# Patient Record
Sex: Male | Born: 1970 | Race: Black or African American | Hispanic: No | Marital: Married | Smoking: Never smoker
Health system: Southern US, Community
[De-identification: ages and names within clinical notes are randomized; demographics above are authoritative.]

## PROBLEM LIST (undated history)

## (undated) DIAGNOSIS — E785 Hyperlipidemia, unspecified: Secondary | ICD-10-CM

## (undated) DIAGNOSIS — B353 Tinea pedis: Secondary | ICD-10-CM

## (undated) DIAGNOSIS — L219 Seborrheic dermatitis, unspecified: Secondary | ICD-10-CM

## (undated) HISTORY — DX: Seborrheic dermatitis, unspecified: L21.9

## (undated) HISTORY — PX: WISDOM TOOTH EXTRACTION: SHX21

## (undated) HISTORY — DX: Tinea pedis: B35.3

## (undated) HISTORY — DX: Hyperlipidemia, unspecified: E78.5

---

## 2000-04-12 ENCOUNTER — Encounter: Admission: RE | Admit: 2000-04-12 | Discharge: 2000-04-12 | Payer: Self-pay | Admitting: Family Medicine

## 2004-08-18 ENCOUNTER — Ambulatory Visit: Payer: Self-pay | Admitting: Family Medicine

## 2005-10-12 ENCOUNTER — Ambulatory Visit: Payer: Self-pay | Admitting: Family Medicine

## 2007-10-09 ENCOUNTER — Encounter: Payer: Self-pay | Admitting: Family Medicine

## 2007-10-10 ENCOUNTER — Ambulatory Visit: Payer: Self-pay | Admitting: Family Medicine

## 2007-10-10 LAB — CONVERTED CEMR LAB
Bilirubin Urine: NEGATIVE
GC Probe Amp, Urine: NEGATIVE
Nitrite: NEGATIVE
Specific Gravity, Urine: 1.02
Urobilinogen, UA: 0.2

## 2007-10-13 ENCOUNTER — Encounter: Payer: Self-pay | Admitting: Family Medicine

## 2009-04-01 ENCOUNTER — Ambulatory Visit: Payer: Self-pay | Admitting: Family Medicine

## 2009-04-01 DIAGNOSIS — L259 Unspecified contact dermatitis, unspecified cause: Secondary | ICD-10-CM

## 2009-04-01 DIAGNOSIS — B353 Tinea pedis: Secondary | ICD-10-CM

## 2010-03-03 NOTE — Assessment & Plan Note (Signed)
Summary: cpe,tcb   Vital Signs:  Patient profile:   40 year old male Height:      65.5 inches Weight:      157.7 pounds BMI:     25.94 Pulse rate:   70 / minute BP sitting:   117 / 70  (right arm)  Vitals Entered By: Renato Battles slade,cma CC: physical Is Patient Diabetic? No Pain Assessment Patient in pain? no        CC:  physical.  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 The have a sitter some days.  Son, Berna Spare age one, doesn't want to stay in the stroller  Research analysis with stressful deadlines.   Rash betwen toes. Used Miconazole before. Sweats over sternum and gets mildly itchy rash there.   Habits & Providers  Alcohol-Tobacco-Diet     Tobacco Status: never  Current Medications (verified): 1)  None  Allergies (verified): No Known Drug Allergies  Review of Systems       rare headache   Physical Exam  General:  Well-developed,well-nourished,in no acute distress; alert,appropriate and cooperative throughout examination Head:  Normocephalic and atraumatic without obvious abnormalities. No apparent alopecia or balding. Eyes:  No corneal or conjunctival inflammation noted. EOMI. Perrla. Funduscopic exam benign, without hemorrhages, exudates or papilledema. Vision grossly normal. Ears:  External ear exam shows no significant lesions or deformities.  Otoscopic examination reveals clear canals, tympanic membranes are intact bilaterally without bulging, retraction, inflammation or discharge. Hearing is grossly normal bilaterally. Nose:  External nasal examination shows no deformity or inflammation. Nasal mucosa are pink and moist without lesions or exudates. Mouth:  Oral mucosa and oropharynx without lesions or exudates.  Teeth in good repair. Neck:  No deformities, masses, or tenderness noted. Lungs:  Normal respiratory effort, chest expands symmetrically. Lungs are clear  to auscultation, no crackles or wheezes. Heart:  Normal rate and regular rhythm. S1 and S2 normal without gallop, murmur, click, rub or other extra sounds. Abdomen:  Bowel sounds positive,abdomen soft and non-tender without masses, organomegaly or hernias noted. Mildly obese Genitalia:  circumcised, no hydrocele, no varicocele, and no scrotal masses.   Msk:  No deformity or scoliosis noted of thoracic or lumbar spine.   Extremities:  No clubbing, cyanosis, edema, or deformity noted with normal full range of motion of all joints.   Neurologic:  No cranial nerve deficits noted. Station and gait are normal. Plantar reflexes are down-going bilaterally. DTRs are symmetrical throughout. Sensory, motor and coordinative functions appear intact. Skin:  Tinea changes between toes  Thickened dark skin patch 3x5 cm over upper sternum.  Cervical Nodes:  No lymphadenopathy noted Axillary Nodes:  No palpable lymphadenopathy Inguinal Nodes:  No significant adenopathy Psych:  As usual,  subdued and poor eye contact but pleasant and cooperative.    Impression & Recommendations:  Problem # 1:  OVERWEIGHT (ICD-278.02) Encouraged calorie reduction and exercise. Orders: [facility] - Est  18-39 yrs ([id number])  Problem # 2:  ECZEMA (ICD-692.9)  Hydrocortisone two times a day to sternum  His updated medication list for this problem includes:    Hydrocortisone 1 % Crea (Hydrocortisone) .Marland Kitchen... Apply two times a day prn  Problem # 3:  DERMATOPHYTOSIS OF FOOT (ICD-110.4)  Miconazole 3-4 weeks Orders: [facility] - Est  18-39 yrs ([id number])  His updated medication list for this problem includes:    Micaderm 2 % Crea (Miconazole nitrate) .Marland Kitchen... Apply two times a  day  Complete Medication List: 1)  Micaderm 2 % Crea (Miconazole nitrate) .... Apply two times a day 2)  Hydrocortisone 1 % Crea (Hydrocortisone) .... Apply two times a day prn  Other Orders: [facility] ([id number])  Patient Instructions: 1)  Please schedule a follow-up  appointment in 1 year.  2)  You need to lose weight. Consider a lower calorie diet and regular exercise. Keep your waist size under 36 at belly button level. 3)  Apply hydrocortisone 1% to the chest rash twice daily when irritated.  4)  Miconazole two times a day between toes for fungus.  5)  Recheck age 80   Prevention & Chronic Care Immunizations   Influenza vaccine: Not documented    Tetanus booster: 08/01/2004: Done.   Tetanus booster due: 08/02/2014    Pneumococcal vaccine: Not documented   Pneumococcal vaccine deferral: Not indicated  (04/01/2009)  Other Screening   Smoking status: never  (04/01/2009)  Lipids   Total Cholesterol: Not documented   LDL: 106  (10/13/2005)   LDL Direct: Not documented   HDL: 47  (10/13/2005)   Triglycerides: Not documented  Laboratory Results  Date/Time Received: April 01, 2009 4:45 PM  Date/Time Reported: April 01, 2009 5:01 PM   Other Tests  Skin KOH: Negative Comments: ...........test performed by...........Marland KitchenTerese Door, CMA

## 2010-03-08 ENCOUNTER — Encounter: Payer: Self-pay | Admitting: *Deleted

## 2010-06-30 ENCOUNTER — Encounter: Payer: Self-pay | Admitting: Family Medicine

## 2010-06-30 ENCOUNTER — Ambulatory Visit (INDEPENDENT_AMBULATORY_CARE_PROVIDER_SITE_OTHER): Payer: Managed Care, Other (non HMO) | Admitting: Family Medicine

## 2010-06-30 VITALS — BP 118/70 | HR 70 | Temp 98.0°F | Ht 66.0 in | Wt 162.0 lb

## 2010-06-30 DIAGNOSIS — M546 Pain in thoracic spine: Secondary | ICD-10-CM | POA: Insufficient documentation

## 2010-06-30 MED ORDER — IBUPROFEN 200 MG PO TABS: 600.0000 mg | ORAL_TABLET | Freq: Four times a day (QID) | ORAL | Status: AC | PRN

## 2010-06-30 NOTE — Progress Notes (Signed)
  Subjective:    Patient ID: [name], male    DOB: [birth year], 40 y.o.   MRN: [id number]  HPI He has been on paternity leave for over a month for the birth of his second child. This last month he's had pain in his right mid back 3-4 times daily usually with leaning forward or twisting to the side. He is not aware of any specific injury but has been picking up his 57-year-old son who now weighs over 30 pounds. He denies pain with deep breath or cough. He denies cough shortness of breath or urinary tract symptoms. Been gradually gaining weight and has not been exercising recently.   He recently turned 40 and his wife is nearing 48 and they are considering him having a vasectomy.   His athlete foot symptoms have resolved and he is no longer using hydrocortisone cream Review of Systems     Objective:   Physical Exam well-appearing mild abdominal obesity. Quiet demeanor as usual. Chest clear heart regular rhythm without murmur Mild tenderness with pressure over the posterior 11th and 12th ribs pain was not reproduced with observed bending forward or trunk twisting abdomen soft without masses or tenderness        Assessment & Plan:

## 2010-06-30 NOTE — Assessment & Plan Note (Signed)
Appears to be musculoskeletal, Probably related to lifting his son more being home on paternity leave

## 2010-06-30 NOTE — Patient Instructions (Signed)
Call Dr Sheffield Slider in one week if the back pain is not improved.   Gradually increase exercise and warm up before sports

## 2010-07-08 ENCOUNTER — Telehealth: Payer: Self-pay | Admitting: Family Medicine

## 2010-07-08 NOTE — Telephone Encounter (Signed)
Pt calling to let MD know he does not have anymore back pain.

## 2011-04-20 ENCOUNTER — Ambulatory Visit (INDEPENDENT_AMBULATORY_CARE_PROVIDER_SITE_OTHER): Payer: Managed Care, Other (non HMO) | Admitting: Family Medicine

## 2011-04-20 ENCOUNTER — Encounter: Payer: Self-pay | Admitting: Family Medicine

## 2011-04-20 VITALS — BP 104/82 | HR 76 | Temp 98.2°F | Ht 66.0 in | Wt 163.0 lb

## 2011-04-20 DIAGNOSIS — H109 Unspecified conjunctivitis: Secondary | ICD-10-CM | POA: Insufficient documentation

## 2011-04-20 MED ORDER — POLYMYXIN B-TRIMETHOPRIM 10000-0.1 UNIT/ML-% OP SOLN: 1.0000 [drp] | Freq: Four times a day (QID) | OPHTHALMIC | Status: AC

## 2011-04-20 NOTE — Patient Instructions (Signed)
I have sent in antibiotic drops for you Please use them 4 times a day for 5 days. Call us if you are still having significant drainage in 2 days

## 2011-04-20 NOTE — Progress Notes (Signed)
  Subjective:    Patient ID: [name], male    DOB: September 20, 1970, 41 y.o.   MRN: [id number]  HPI Here with one day of left eye redness and drainage.  Both of his kids have had pink eye in the last two weeks.  He woke up with crusting and it has continued to drain.  He does not have aversion to light, pain in the eye or itching or vision change.  He does not wear contacts.   He is other wise healthy. Review of Systems See above    Objective:   Physical Exam  Vital signs reviewed General appearance - alert, well appearing, and in no distress and oriented to person, place, and time Eyes- left eye with conjunctival injection.  Eye lashes with yellow-green discharge stuck to them.  EOMI.  PERRL      Assessment & Plan:

## 2011-04-20 NOTE — Assessment & Plan Note (Signed)
Gave drops for likely bacterial conjunctivitis.  To return in 2 days if no improvement

## 2011-04-21 ENCOUNTER — Telehealth: Payer: Self-pay | Admitting: Family Medicine

## 2011-04-21 NOTE — Telephone Encounter (Signed)
Returned call to patient.  Seen yesterday for conjunctivitis.  Used eye drops x 4 yesterday and has used twice today.  Eye was swollen shut last night, but swelling has gone down some today.  Eye still sore, itchy, and very red.  Has had some clear eye drainage.  Informed patient to continue using eye drops as prescribed and call back if no improvement by tomorrow per yesterday's office note.  Patient verbalized understanding.  Gaylene Brooks, RN

## 2011-04-21 NOTE — Telephone Encounter (Signed)
Eyes are still red and painful and has put drops in 4 times yesterday and twice today - wants to know if that is normal

## 2011-04-22 ENCOUNTER — Ambulatory Visit (INDEPENDENT_AMBULATORY_CARE_PROVIDER_SITE_OTHER): Payer: Managed Care, Other (non HMO) | Admitting: Family Medicine

## 2011-04-22 ENCOUNTER — Encounter: Payer: Self-pay | Admitting: Family Medicine

## 2011-04-22 VITALS — BP 118/80 | HR 64 | Temp 99.5°F | Ht 66.0 in | Wt 162.0 lb

## 2011-04-22 DIAGNOSIS — H109 Unspecified conjunctivitis: Secondary | ICD-10-CM

## 2011-04-22 DIAGNOSIS — J029 Acute pharyngitis, unspecified: Secondary | ICD-10-CM

## 2011-04-22 DIAGNOSIS — J069 Acute upper respiratory infection, unspecified: Secondary | ICD-10-CM

## 2011-04-22 LAB — POCT RAPID STREP A (OFFICE): Rapid Strep A Screen: NEGATIVE

## 2011-04-22 NOTE — Progress Notes (Signed)
  Subjective:    Patient ID: [name], male    DOB: March 03, 1970, 41 y.o.   MRN: [id number]  HPI Cold symptoms: Patient reports cold symptoms x3 days. Started with pinkeye. Was seen here clinic and given antibiotic drops. Has had some minimal improvement in pinkeye and then right eye became infected. Positive chills. Positive body aches. Positive sore throat. Positive runny nose. Tylenol seems to help body aches. Eating well. Drinking well. No nausea. No vomiting. No diarrhea. No changes in vision. No abdominal pain.   Review of Systems As per above.    Objective:   Physical Exam  HENT:  Head: Normocephalic and atraumatic.  Mouth/Throat: No oropharyngeal exudate.       TM wnl bilateral.  Mild throat erythema.  + clear nasal discharge.   Eyes: EOM are normal. Pupils are equal, round, and reactive to light. Right eye exhibits discharge. Left eye exhibits discharge.       + erythematous conjunctiva bilateral.  Right worse than left.  + yellow discharge.    Neck: Neck supple.  Cardiovascular: Normal rate, regular rhythm and normal heart sounds.   No murmur heard. Pulmonary/Chest: Effort normal and breath sounds normal. No respiratory distress. He has no wheezes. He has no rales.  Abdominal: Soft. He exhibits no distension. There is no tenderness. There is no rebound.  Musculoskeletal: He exhibits no edema.  Lymphadenopathy:    He has cervical adenopathy (submandibular- shoddy).  Neurological: He is alert.       Normal gait  Skin: No rash noted.          Assessment & Plan:

## 2011-04-22 NOTE — Patient Instructions (Signed)
Body aches and sorethroat: Motrin and tylenol as needed.   Sorethroat: Chloraseptic spray, Salt water gargles Continue to drink lots of fluid  Pink eye: Continue to use antibiotic drops-- if this is a viral conjunctivitis this may not help.  Runny nose- Nasal saline spray.   Return if new or worsening of symptoms.

## 2011-04-24 DIAGNOSIS — J069 Acute upper respiratory infection, unspecified: Secondary | ICD-10-CM | POA: Insufficient documentation

## 2011-04-24 NOTE — Assessment & Plan Note (Signed)
Symptomatic treatment only at this time. Reviewed red flags for return.  Return if no improvement or if new or worsening of symptoms.

## 2011-04-24 NOTE — Assessment & Plan Note (Signed)
Continue to use antibiotic eye drops--most likely conjunctivitis is viral in the setting of viral uri symptoms - therefore bacterial drops may not help-  But should continue to use in case there is a secondary bacterial infection causing the drainage and continued eye redness.  Pt states understanding.  Pt to Return if no improvement or if new or worsening of symptoms.

## 2012-01-04 ENCOUNTER — Ambulatory Visit (INDEPENDENT_AMBULATORY_CARE_PROVIDER_SITE_OTHER): Payer: Managed Care, Other (non HMO) | Admitting: Family Medicine

## 2012-01-04 ENCOUNTER — Encounter: Payer: Self-pay | Admitting: Family Medicine

## 2012-01-04 ENCOUNTER — Encounter: Payer: Managed Care, Other (non HMO) | Admitting: Family Medicine

## 2012-01-04 VITALS — BP 132/71 | HR 77 | Ht 66.0 in | Wt 157.7 lb

## 2012-01-04 DIAGNOSIS — E663 Overweight: Secondary | ICD-10-CM

## 2012-01-04 DIAGNOSIS — L259 Unspecified contact dermatitis, unspecified cause: Secondary | ICD-10-CM

## 2012-01-04 DIAGNOSIS — Z0000 Encounter for general adult medical examination without abnormal findings: Secondary | ICD-10-CM

## 2012-01-04 DIAGNOSIS — B353 Tinea pedis: Secondary | ICD-10-CM

## 2012-01-04 DIAGNOSIS — L219 Seborrheic dermatitis, unspecified: Secondary | ICD-10-CM

## 2012-01-04 HISTORY — DX: Seborrheic dermatitis, unspecified: L21.9

## 2012-01-04 HISTORY — DX: Tinea pedis: B35.3

## 2012-01-04 MED ORDER — TERBINAFINE HCL 1 % EX CREA: TOPICAL_CREAM | Freq: Two times a day (BID) | CUTANEOUS | Status: DC

## 2012-01-04 NOTE — Assessment & Plan Note (Signed)
Recommended he use Lamisil more frequently between his toes.

## 2012-01-04 NOTE — Patient Instructions (Addendum)
Please send me your wellness results  I'll let you know the fungus scraping result  Please return to see Dr Sheffield Slider in 1 year.

## 2012-01-04 NOTE — Progress Notes (Signed)
  Subjective:    Patient ID: [name], male    DOB: 12/07/1970, 41 y.o.   MRN: [id number]  HPI A week and a half ago he developed pain in his right forehead without lesions, scaling of his lower forehead and eyebrows and a tender papule in his lower right eyebrow plus a tender right preauricular nodule. Only the scaling and a decreasing papule remains. He never has blisters. No similar illness in his family.   He had a health screen at work that indicated that his cholesterol is normal, as were his blood pressure, weight, and waist size Review of Systems  Constitutional: Negative for fever and chills.  HENT: Positive for postnasal drip. Negative for ear pain, congestion, sore throat and ear discharge.   Eyes: Negative for discharge and redness.  Respiratory: Negative for cough.   Cardiovascular: Negative for chest pain.  Gastrointestinal: Negative for diarrhea and constipation.  Musculoskeletal: Negative for myalgias, back pain and arthralgias.  Neurological: Negative for headaches.  Hematological: Positive for adenopathy.  Psychiatric/Behavioral: Negative for sleep disturbance and agitation.       Objective:   Physical Exam  Constitutional: He appears well-developed and well-nourished.  HENT:  Head: Normocephalic.  Right Ear: External ear normal.  Left Ear: External ear normal.  Nose: Nose normal.  Mouth/Throat: Oropharynx is clear and moist.  Eyes: Conjunctivae normal are normal. Pupils are equal, round, and reactive to light. Right eye exhibits no discharge. Left eye exhibits discharge. No scleral icterus.  Neck: No thyromegaly present.  Cardiovascular: Normal rate and regular rhythm.   No murmur heard. Pulmonary/Chest: Effort normal and breath sounds normal. He has no rales.  Abdominal: Soft. Bowel sounds are normal. He exhibits no mass. There is no tenderness.  Genitourinary: Penis normal.       Testes normal  Musculoskeletal: Normal range of motion. He exhibits no  edema and no tenderness.  Lymphadenopathy:    He has no cervical adenopathy.  Neurological: He is alert. No cranial nerve deficit. Coordination normal.  Skin: Skin is warm and dry. Rash noted.       superficial desquamation of lower forehead into eyebrows  Palpable firm 2 mm nodule right mid eyebrow.  Maceration and fissuring interdigitally lateral 3 toes bilaterally  Psychiatric: He has a normal mood and affect. His behavior is normal. Judgment and thought content normal.          Assessment & Plan:

## 2012-01-04 NOTE — Assessment & Plan Note (Signed)
Could be seborrhea on his forehead vs a contact dermatitis. KOH was negative for fungus on the forehead.

## 2012-01-04 NOTE — Assessment & Plan Note (Signed)
It persistent may treat with Nizoral

## 2012-01-04 NOTE — Assessment & Plan Note (Signed)
Nearly down to the normal range

## 2012-01-18 ENCOUNTER — Telehealth: Payer: Self-pay | Admitting: Family Medicine

## 2012-01-18 NOTE — Telephone Encounter (Signed)
His blood pressure was 134/100 on his screen at work on 12/2011. Waist size 36 BMI 24.0 Total cholesterol 165

## 2013-07-09 ENCOUNTER — Ambulatory Visit (INDEPENDENT_AMBULATORY_CARE_PROVIDER_SITE_OTHER): Payer: Managed Care, Other (non HMO) | Admitting: Family Medicine

## 2013-07-09 VITALS — BP 132/80 | HR 73 | Temp 97.8°F | Ht 66.0 in | Wt 159.0 lb

## 2013-07-09 DIAGNOSIS — M79609 Pain in unspecified limb: Secondary | ICD-10-CM

## 2013-07-09 DIAGNOSIS — M79646 Pain in unspecified finger(s): Secondary | ICD-10-CM | POA: Insufficient documentation

## 2013-07-09 DIAGNOSIS — Z0000 Encounter for general adult medical examination without abnormal findings: Secondary | ICD-10-CM

## 2013-07-09 NOTE — Assessment & Plan Note (Signed)
43 y/o male for annual well visit. -Up to date on immunizations -Not a candidate for colon or prostate cancer screening at this time -Dicussed the importance of a healthy diet and regular exercise -Routine lab work obtained

## 2013-07-09 NOTE — Patient Instructions (Signed)
Please schedule an appointment with our lab to get your lab work completed. Please fast for at least 8 hours.  Dr. Ree Kida will call you with your results.   Preventive Care for Adults, Male A healthy lifestyle and preventive care can promote health and wellness. Preventive health guidelines for men include the following key practices:  A routine yearly physical is a good way to check with your health care provider about your health and preventative screening. It is a chance to share any concerns and updates on your health and to receive a thorough exam.  Visit your dentist for a routine exam and preventative care every 6 months. Brush your teeth twice a day and floss once a day. Good oral hygiene prevents tooth decay and gum disease.  The frequency of eye exams is based on your age, health, family medical history, use of contact lenses, and other factors. Follow your health care provider's recommendations for frequency of eye exams.  Eat a healthy diet. Foods such as vegetables, fruits, whole grains, low-fat dairy products, and lean protein foods contain the nutrients you need without too many calories. Decrease your intake of foods high in solid fats, added sugars, and salt. Eat the right amount of calories for you.Get information about a proper diet from your health care provider, if necessary.  Regular physical exercise is one of the most important things you can do for your health. Most adults should get at least 150 minutes of moderate-intensity exercise (any activity that increases your heart rate and causes you to sweat) each week. In addition, most adults need muscle-strengthening exercises on 2 or more days a week.  Maintain a healthy weight. The body mass index (BMI) is a screening tool to identify possible weight problems. It provides an estimate of body fat based on height and weight. Your health care provider can find your BMI and can help you achieve or maintain a healthy weight.For  adults 20 years and older:  A BMI below 18.5 is considered underweight.  A BMI of 18.5 to 24.9 is normal.  A BMI of 25 to 29.9 is considered overweight.  A BMI of 30 and above is considered obese.  Maintain normal blood lipids and cholesterol levels by exercising and minimizing your intake of saturated fat. Eat a balanced diet with plenty of fruit and vegetables. Blood tests for lipids and cholesterol should begin at age 53 and be repeated every 5 years. If your lipid or cholesterol levels are high, you are over 50, or you are at high risk for heart disease, you may need your cholesterol levels checked more frequently.Ongoing high lipid and cholesterol levels should be treated with medicines if diet and exercise are not working.  If you smoke, find out from your health care provider how to quit. If you do not use tobacco, do not start.  Lung cancer screening is recommended for adults aged 33 80 years who are at high risk for developing lung cancer because of a history of smoking. A yearly low-dose CT scan of the lungs is recommended for people who have at least a 30-pack-year history of smoking and are a current smoker or have quit within the past 15 years. A pack year of smoking is smoking an average of 1 pack of cigarettes a day for 1 year (for example: 1 pack a day for 30 years or 2 packs a day for 15 years). Yearly screening should continue until the smoker has stopped smoking for at least 15 years.  Yearly screening should be stopped for people who develop a health problem that would prevent them from having lung cancer treatment.  If you choose to drink alcohol, do not have more than 2 drinks per day. One drink is considered to be 12 ounces (355 mL) of beer, 5 ounces (148 mL) of wine, or 1.5 ounces (44 mL) of liquor.  Avoid use of street drugs. Do not share needles with anyone. Ask for help if you need support or instructions about stopping the use of drugs.  High blood pressure causes  heart disease and increases the risk of stroke. Your blood pressure should be checked at least every 1 2 years. Ongoing high blood pressure should be treated with medicines, if weight loss and exercise are not effective.  If you are 43 43 years old, ask your health care provider if you should take aspirin to prevent heart disease.  Diabetes screening involves taking a blood sample to check your fasting blood sugar level. This should be done once every 3 years, after age 42, if you are within normal weight and without risk factors for diabetes. Testing should be considered at a younger age or be carried out more frequently if you are overweight and have at least 1 risk factor for diabetes.  Colorectal cancer can be detected and often prevented. Most routine colorectal cancer screening begins at the age of 41 and continues through age 27. However, your health care provider may recommend screening at an earlier age if you have risk factors for colon cancer. On a yearly basis, your health care provider may provide home test kits to check for hidden blood in the stool. Use of a small camera at the end of a tube to directly examine the colon (sigmoidoscopy or colonoscopy) can detect the earliest forms of colorectal cancer. Talk to your health care provider about this at age 40, when routine screening begins. Direct exam of the colon should be repeated every 5 10 years through age 34, unless early forms of precancerous polyps or small growths are found.  People who are at an increased risk for hepatitis B should be screened for this virus. You are considered at high risk for hepatitis B if:  You were born in a country where hepatitis B occurs often. Talk with your health care provider about which countries are considered high-risk.  Your parents were born in a high-risk country and you have not received a shot to protect against hepatitis B (hepatitis B vaccine).  You have HIV or AIDS.  You use needles to  inject street drugs.  You live with, or have sex with, someone who has hepatitis B.  You are a man who has sex with other men (MSM).  You get hemodialysis treatment.  You take certain medicines for conditions such as cancer, organ transplantation, and autoimmune conditions.  Hepatitis C blood testing is recommended for all people born from [birth year] through 1965 and any individual with known risks for hepatitis C.  Practice safe sex. Use condoms and avoid high-risk sexual practices to reduce the spread of sexually transmitted infections (STIs). STIs include gonorrhea, chlamydia, syphilis, trichomonas, herpes, HPV, and human immunodeficiency virus (HIV). Herpes, HIV, and HPV are viral illnesses that have no cure. They can result in disability, cancer, and death.  A one-time screening for abdominal aortic aneurysm (AAA) and surgical repair of large AAAs by ultrasound are recommended for men ages 51 to 59 years who are current or former smokers.  Healthy men should  no longer receive prostate-specific antigen (PSA) blood tests as part of routine cancer screening. Talk with your health care provider about prostate cancer screening.  Testicular cancer screening is not recommended for adult males who have no symptoms. Screening includes self-exam, a health care provider exam, and other screening tests. Consult with your health care provider about any symptoms you have or any concerns you have about testicular cancer.  Use sunscreen. Apply sunscreen liberally and repeatedly throughout the day. You should seek shade when your shadow is shorter than you. Protect yourself by wearing long sleeves, pants, a wide-brimmed hat, and sunglasses year round, whenever you are outdoors.  Once a month, do a whole-body skin exam, using a mirror to look at the skin on your back. Tell your health care provider about new moles, moles that have irregular borders, moles that are larger than a pencil eraser, or moles that have  changed in shape or color.  Stay current with required vaccines (immunizations).  Influenza vaccine. All adults should be immunized every year.  Tetanus, diphtheria, and acellular pertussis (Td, Tdap) vaccine. An adult who has not previously received Tdap or who does not know his vaccine status should receive 1 dose of Tdap. This initial dose should be followed by tetanus and diphtheria toxoids (Td) booster doses every 10 years. Adults with an unknown or incomplete history of completing a 3-dose immunization series with Td-containing vaccines should begin or complete a primary immunization series including a Tdap dose. Adults should receive a Td booster every 10 years.  Varicella vaccine. An adult without evidence of immunity to varicella should receive 2 doses or a second dose if he has previously received 1 dose.  Human papillomavirus (HPV) vaccine. Males aged 57 21 years who have not received the vaccine previously should receive the 3-dose series. Males aged 31 26 years may be immunized. Immunization is recommended through the age of 67 years for any male who has sex with males and did not get any or all doses earlier. Immunization is recommended for any person with an immunocompromised condition through the age of 29 years if he did not get any or all doses earlier. During the 3-dose series, the second dose should be obtained 4 8 weeks after the first dose. The third dose should be obtained 24 weeks after the first dose and 16 weeks after the second dose.  Zoster vaccine. One dose is recommended for adults aged 86 years or older unless certain conditions are present.  Measles, mumps, and rubella (MMR) vaccine. Adults born before [birth year] generally are considered immune to measles and mumps. Adults born in [birth year] or later should have 1 or more doses of MMR vaccine unless there is a contraindication to the vaccine or there is laboratory evidence of immunity to each of the three diseases. A routine second  dose of MMR vaccine should be obtained at least 28 days after the first dose for students attending postsecondary schools, health care workers, or international travelers. People who received inactivated measles vaccine or an unknown type of measles vaccine during 1963 1967 should receive 2 doses of MMR vaccine. People who received inactivated mumps vaccine or an unknown type of mumps vaccine before 1979 and are at high risk for mumps infection should consider immunization with 2 doses of MMR vaccine. Unvaccinated health care workers born before [birth year] who lack laboratory evidence of measles, mumps, or rubella immunity or laboratory confirmation of disease should consider measles and mumps immunization with 2 doses of MMR vaccine or  rubella immunization with 1 dose of MMR vaccine.  Pneumococcal 13-valent conjugate (PCV13) vaccine. When indicated, a person who is uncertain of his immunization history and has no record of immunization should receive the PCV13 vaccine. An adult aged 46 years or older who has certain medical conditions and has not been previously immunized should receive 1 dose of PCV13 vaccine. This PCV13 should be followed with a dose of pneumococcal polysaccharide (PPSV23) vaccine. The PPSV23 vaccine dose should be obtained at least 8 weeks after the dose of PCV13 vaccine. An adult aged 65 years or older who has certain medical conditions and previously received 1 or more doses of PPSV23 vaccine should receive 1 dose of PCV13. The PCV13 vaccine dose should be obtained 1 or more years after the last PPSV23 vaccine dose.  Pneumococcal polysaccharide (PPSV23) vaccine. When PCV13 is also indicated, PCV13 should be obtained first. All adults aged 15 years and older should be immunized. An adult younger than age 57 years who has certain medical conditions should be immunized. Any person who resides in a nursing home or long-term care facility should be immunized. An adult smoker should be immunized.  People with an immunocompromised condition and certain other conditions should receive both PCV13 and PPSV23 vaccines. People with human immunodeficiency virus (HIV) infection should be immunized as soon as possible after diagnosis. Immunization during chemotherapy or radiation therapy should be avoided. Routine use of PPSV23 vaccine is not recommended for American Indians, [place] Natives, or people younger than 65 years unless there are medical conditions that require PPSV23 vaccine. When indicated, people who have unknown immunization and have no record of immunization should receive PPSV23 vaccine. One-time revaccination 5 years after the first dose of PPSV23 is recommended for people aged 65 64 years who have chronic kidney failure, nephrotic syndrome, asplenia, or immunocompromised conditions. People who received 1 2 doses of PPSV23 before age 46 years should receive another dose of PPSV23 vaccine at age 48 years or later if at least 5 years have passed since the previous dose. Doses of PPSV23 are not needed for people immunized with PPSV23 at or after age 41 years.  Meningococcal vaccine. Adults with asplenia or persistent complement component deficiencies should receive 2 doses of quadrivalent meningococcal conjugate (MenACWY-D) vaccine. The doses should be obtained at least 2 months apart. Microbiologists working with certain meningococcal bacteria, [place] recruits, people at risk during an outbreak, and people who travel to or live in countries with a high rate of meningitis should be immunized. A first-year college student up through age 56 years who is living in a residence hall should receive a dose if he did not receive a dose on or after his 16th birthday. Adults who have certain high-risk conditions should receive one or more doses of vaccine.  Hepatitis A vaccine. Adults who wish to be protected from this disease, have certain high-risk conditions, work with hepatitis A-infected animals, work  in hepatitis A research labs, or travel to or work in countries with a high rate of hepatitis A should be immunized. Adults who were previously unvaccinated and who anticipate close contact with an international adoptee during the first 60 days after arrival in the [place] from a country with a high rate of hepatitis A should be immunized.  Hepatitis B vaccine. Adults who wish to be protected from this disease, have certain high-risk conditions, may be exposed to blood or other infectious body fluids, are household contacts or sex partners of hepatitis B positive people, are clients  or workers in certain care facilities, or travel to or work in countries with a high rate of hepatitis B should be immunized.  Haemophilus influenzae type b (Hib) vaccine. A previously unvaccinated person with asplenia or sickle cell disease or having a scheduled splenectomy should receive 1 dose of Hib vaccine. Regardless of previous immunization, a recipient of a hematopoietic stem cell transplant should receive a 3-dose series 6 12 months after his successful transplant. Hib vaccine is not recommended for adults with HIV infection. Preventive Service / Frequency Ages 26 to 67  Blood pressure check.** / Every 1 to 2 years.  Lipid and cholesterol check.** / Every 5 years beginning at age 16.  Hepatitis C blood test.** / For any individual with known risks for hepatitis C.  Skin self-exam. / Monthly.  Influenza vaccine. / Every year.  Tetanus, diphtheria, and acellular pertussis (Tdap, Td) vaccine.** / Consult your health care provider. 1 dose of Td every 10 years.  Varicella vaccine.** / Consult your health care provider.  HPV vaccine. / 3 doses over 6 months, if 26 or younger.  Measles, mumps, rubella (MMR) vaccine.** / You need at least 1 dose of MMR if you were born in 1957 or later. You may also need a second dose.  Pneumococcal 13-valent conjugate (PCV13) vaccine.** / Consult your health care  provider.  Pneumococcal polysaccharide (PPSV23) vaccine.** / 1 to 2 doses if you smoke cigarettes or if you have certain conditions.  Meningococcal vaccine.** / 1 dose if you are age 45 to 60 years and a Market researcher living in a residence hall, or have one of several medical conditions. You may also need additional booster doses.  Hepatitis A vaccine.** / Consult your health care provider.  Hepatitis B vaccine.** / Consult your health care provider.  Haemophilus influenzae type b (Hib) vaccine.** / Consult your health care provider. Ages 62 to 22  Blood pressure check.** / Every 1 to 2 years.  Lipid and cholesterol check.** / Every 5 years beginning at age 4.  Lung cancer screening. / Every year if you are aged 51 80 years and have a 30-pack-year history of smoking and currently smoke or have quit within the past 15 years. Yearly screening is stopped once you have quit smoking for at least 15 years or develop a health problem that would prevent you from having lung cancer treatment.  Fecal occult blood test (FOBT) of stool. / Every year beginning at age 30 and continuing until age 44. You may not have to do this test if you get a colonoscopy every 10 years.  Flexible sigmoidoscopy** or colonoscopy.** / Every 5 years for a flexible sigmoidoscopy or every 10 years for a colonoscopy beginning at age 63 and continuing until age 57.  Hepatitis C blood test.** / For all people born from [birth year] through 1965 and any individual with known risks for hepatitis C.  Skin self-exam. / Monthly.  Influenza vaccine. / Every year.  Tetanus, diphtheria, and acellular pertussis (Tdap/Td) vaccine.** / Consult your health care provider. 1 dose of Td every 10 years.  Varicella vaccine.** / Consult your health care provider.  Zoster vaccine.** / 1 dose for adults aged 31 years or older.  Measles, mumps, rubella (MMR) vaccine.** / You need at least 1 dose of MMR if you were born in 1957 or  later. You may also need a second dose.  Pneumococcal 13-valent conjugate (PCV13) vaccine.** / Consult your health care provider.  Pneumococcal polysaccharide (PPSV23) vaccine.** / 1  to 2 doses if you smoke cigarettes or if you have certain conditions.  Meningococcal vaccine.** / Consult your health care provider.  Hepatitis A vaccine.** / Consult your health care provider.  Hepatitis B vaccine.** / Consult your health care provider.  Haemophilus influenzae type b (Hib) vaccine.** / Consult your health care provider. Ages 40 and over  Blood pressure check.** / Every 1 to 2 years.  Lipid and cholesterol check.**/ Every 5 years beginning at age 74.  Lung cancer screening. / Every year if you are aged 78 80 years and have a 30-pack-year history of smoking and currently smoke or have quit within the past 15 years. Yearly screening is stopped once you have quit smoking for at least 15 years or develop a health problem that would prevent you from having lung cancer treatment.  Fecal occult blood test (FOBT) of stool. / Every year beginning at age 25 and continuing until age 6. You may not have to do this test if you get a colonoscopy every 10 years.  Flexible sigmoidoscopy** or colonoscopy.** / Every 5 years for a flexible sigmoidoscopy or every 10 years for a colonoscopy beginning at age 41 and continuing until age 9.  Hepatitis C blood test.** / For all people born from [birth year] through 1965 and any individual with known risks for hepatitis C.  Abdominal aortic aneurysm (AAA) screening.** / A one-time screening for ages 36 to 45 years who are current or former smokers.  Skin self-exam. / Monthly.  Influenza vaccine. / Every year.  Tetanus, diphtheria, and acellular pertussis (Tdap/Td) vaccine.** / 1 dose of Td every 10 years.  Varicella vaccine.** / Consult your health care provider.  Zoster vaccine.** / 1 dose for adults aged 37 years or older.  Pneumococcal 13-valent conjugate  (PCV13) vaccine.** / Consult your health care provider.  Pneumococcal polysaccharide (PPSV23) vaccine.** / 1 dose for all adults aged 35 years and older.  Meningococcal vaccine.** / Consult your health care provider.  Hepatitis A vaccine.** / Consult your health care provider.  Hepatitis B vaccine.** / Consult your health care provider.  Haemophilus influenzae type b (Hib) vaccine.** / Consult your health care provider. **Family history and personal history of risk and conditions may change your health care provider's recommendations. Document Released: 03/16/2001 Document Revised: 11/08/2012 Document Reviewed: 06/15/2010 [facility] Patient Information 2014 [place], [place].

## 2013-07-09 NOTE — Assessment & Plan Note (Signed)
Intermittent left thumb pain. No acute tenderness/redness/swelling -attempt trial of NSAID's and ice

## 2013-07-09 NOTE — Progress Notes (Signed)
   Subjective:    Patient ID: [name], male    DOB: Jul 03, 1970, 43 y.o.   MRN: [id number]  HPI 43 y/o male presents for annual preventative visit.  Acute issues: left thumb pain, intermittent over the past few weeks, no injury, no swelling or redness, pain mostly over the DIP joints when he applies pressure to the area, has not attempted any otc medications or heat/ice  Exercise - works out 1-2 times per week  Cancer Screening:  Colon: not a candidate  Prostate: no hesitancy, no nocturia, good urine flow  Immunizations:  Tetanus: 5670  Flu: uncertain  Pneumovax: not a candidate   FH - no family history of colon or prostate cancer  Updated Social History in EPIC   Review of Systems  Constitutional: Negative for fever, chills and fatigue.  Respiratory: Negative for cough and shortness of breath.   Cardiovascular: Negative for chest pain.  Gastrointestinal: Negative for nausea and vomiting.  Musculoskeletal: Positive for arthralgias.       Objective:   Physical Exam Vitals: reviewed Gen: pleasant AAM, NAD HEENT: normocephalic, PERRL, EOMI, no scleral icterus, bilateral TM's pearly grey, nasal septum midline, MMM, uvula midline, no pharyngeal erythema or exudate noted, no thyromegaly, no cervical lymphadenopathy Cardiac: RRR, S1 and S2 present, no murmurs, no heaves/thrills Resp: CTAB, normal effort Abd: soft, no tenderness, no organomegaly, normal bowel sounds Ext: no edema, 2+ radial and DP pulses bilaterally Skin: no suspicious skin lesions MSK: normal ROM of left thumb DIP/PIP, no swelling, no point tenderness of the left thumb     Assessment & Plan:  Please see problem specific assessment and plan.

## 2013-07-24 ENCOUNTER — Encounter: Payer: Self-pay | Admitting: Family Medicine

## 2013-07-24 ENCOUNTER — Ambulatory Visit (INDEPENDENT_AMBULATORY_CARE_PROVIDER_SITE_OTHER): Payer: Managed Care, Other (non HMO) | Admitting: Family Medicine

## 2013-07-24 VITALS — BP 115/77 | HR 64 | Ht 66.0 in | Wt 156.0 lb

## 2013-07-24 DIAGNOSIS — Z0000 Encounter for general adult medical examination without abnormal findings: Secondary | ICD-10-CM

## 2013-07-24 LAB — CMP AND LIVER
ALBUMIN: 4.6 g/dL (ref 3.5–5.2)
ALT: 17 U/L (ref 0–53)
AST: 19 U/L (ref 0–37)
Alkaline Phosphatase: 49 U/L (ref 39–117)
BILIRUBIN INDIRECT: 0.7 mg/dL (ref 0.2–1.2)
BUN: 11 mg/dL (ref 6–23)
Bilirubin, Direct: 0.1 mg/dL (ref 0.0–0.3)
CO2: 24 meq/L (ref 19–32)
Calcium: 9.8 mg/dL (ref 8.4–10.5)
Chloride: 102 mEq/L (ref 96–112)
Creat: 0.84 mg/dL (ref 0.50–1.35)
GLUCOSE: 84 mg/dL (ref 70–99)
POTASSIUM: 4 meq/L (ref 3.5–5.3)
SODIUM: 137 meq/L (ref 135–145)
TOTAL PROTEIN: 7.3 g/dL (ref 6.0–8.3)
Total Bilirubin: 0.8 mg/dL (ref 0.2–1.2)

## 2013-07-24 LAB — CBC
HCT: 40.8 % (ref 39.0–52.0)
HEMOGLOBIN: 14.2 g/dL (ref 13.0–17.0)
MCH: 29.8 pg (ref 26.0–34.0)
MCHC: 34.8 g/dL (ref 30.0–36.0)
MCV: 85.5 fL (ref 78.0–100.0)
PLATELETS: 238 10*3/uL (ref 150–400)
RBC: 4.77 MIL/uL (ref 4.22–5.81)
RDW: 13 % (ref 11.5–15.5)
WBC: 4.1 10*3/uL (ref 4.0–10.5)

## 2013-07-24 LAB — LIPID PANEL
Cholesterol: 207 mg/dL — ABNORMAL HIGH (ref 0–200)
HDL: 46 mg/dL (ref >39)
LDL CALC: 149 mg/dL — AB (ref 0–99)
TRIGLYCERIDES: 59 mg/dL (ref <150)
Total CHOL/HDL Ratio: 4.5 Ratio
VLDL: 12 mg/dL (ref 0–40)

## 2013-07-24 LAB — TSH: TSH: 1.018 u[IU]/mL (ref 0.350–4.500)

## 2013-07-24 LAB — HIV ANTIBODY (ROUTINE TESTING W REFLEX): HIV 1&2 Ab, 4th Generation: NONREACTIVE

## 2013-07-24 NOTE — Progress Notes (Signed)
Patient ID: [name], male   DOB: Jun 16, 1970, 43 y.o.   MRN: [id number] Patient made appointment with MD thinking that he needed to in order to get lab work. No other acute issues today. Patient told that he would not be charged for visit. Sent to lab for blood draw.

## 2013-07-24 NOTE — Addendum Note (Signed)
Addended by: Lianne Bushy on: 07/24/2013 11:15 AM   Modules accepted: Orders

## 2013-07-25 ENCOUNTER — Telehealth: Payer: Self-pay | Admitting: Family Medicine

## 2013-07-25 NOTE — Telephone Encounter (Signed)
Discussed lab work, cholesterol mildly elevated, discussed healthy diet and regular exercise

## 2014-11-07 ENCOUNTER — Ambulatory Visit (INDEPENDENT_AMBULATORY_CARE_PROVIDER_SITE_OTHER): Payer: BLUE CROSS/BLUE SHIELD | Admitting: Family Medicine

## 2014-11-07 VITALS — BP 125/82 | HR 68 | Temp 98.2°F | Ht 66.0 in | Wt 158.0 lb

## 2014-11-07 DIAGNOSIS — M79646 Pain in unspecified finger(s): Secondary | ICD-10-CM

## 2014-11-07 DIAGNOSIS — Z0000 Encounter for general adult medical examination without abnormal findings: Secondary | ICD-10-CM

## 2014-11-07 DIAGNOSIS — Z23 Encounter for immunization: Secondary | ICD-10-CM

## 2014-11-07 LAB — CBC
HCT: 43.4 % (ref 39.0–52.0)
Hemoglobin: 14.8 g/dL (ref 13.0–17.0)
MCH: 29.8 pg (ref 26.0–34.0)
MCHC: 34.1 g/dL (ref 30.0–36.0)
MCV: 87.5 fL (ref 78.0–100.0)
MPV: 10.2 fL (ref 8.6–12.4)
Platelets: 250 10*3/uL (ref 150–400)
RBC: 4.96 MIL/uL (ref 4.22–5.81)
RDW: 12.4 % (ref 11.5–15.5)
WBC: 4.5 10*3/uL (ref 4.0–10.5)

## 2014-11-07 LAB — COMPREHENSIVE METABOLIC PANEL
ALT: 13 U/L (ref 9–46)
AST: 20 U/L (ref 10–40)
Albumin: 4.8 g/dL (ref 3.6–5.1)
Alkaline Phosphatase: 44 U/L (ref 40–115)
BILIRUBIN TOTAL: 0.9 mg/dL (ref 0.2–1.2)
BUN: 11 mg/dL (ref 7–25)
CHLORIDE: 103 mmol/L (ref 98–110)
CO2: 28 mmol/L (ref 20–31)
CREATININE: 0.77 mg/dL (ref 0.60–1.35)
Calcium: 9.6 mg/dL (ref 8.6–10.3)
Glucose, Bld: 77 mg/dL (ref 65–99)
Potassium: 3.9 mmol/L (ref 3.5–5.3)
SODIUM: 138 mmol/L (ref 135–146)
TOTAL PROTEIN: 7.6 g/dL (ref 6.1–8.1)

## 2014-11-07 LAB — LIPID PANEL
Cholesterol: 202 mg/dL — ABNORMAL HIGH (ref 125–200)
HDL: 45 mg/dL (ref >=40)
LDL CALC: 140 mg/dL — AB (ref <130)
Total CHOL/HDL Ratio: 4.5 Ratio (ref <=5.0)
Triglycerides: 86 mg/dL (ref <150)
VLDL: 17 mg/dL (ref <30)

## 2014-11-07 NOTE — Patient Instructions (Signed)
It was nice to see you today.  You have been given a tetanus shot today. Call the office if you would like a flu shot.  Dr. Ree Kida will call you with your lab results.  Please keep up the good work with the exercise.

## 2014-11-07 NOTE — Assessment & Plan Note (Signed)
Patient returns for annual wellness exam -discussed lifestyle modifications -routine labs obtained -declined flu shot -Tetanus shot provided -not a candidate for Colonoscopy or further prostate CA screening

## 2014-11-07 NOTE — Progress Notes (Signed)
   Subjective:    Patient ID: [name], male    DOB: [birth year], 44 y.o.   MRN: [id number]  HPI 44 y/o male presents for annual preventative visit.  Acute issues: No acute issues identified today.   Exercise - works out 3-4 days per week, mostly push ups and sit up  Cancer Screening:  Colon: not a candidate, no family history of colon cancer  Prostate: no hesitancy, no nocturia, good urine flow, father has BPH but no prostate CA  Immunizations:  Tetanus: 2006 (needs today)  Flu: declines today  Pneumovax: not a candidate   FH - no family history of colon or prostate cancer  Updated Social History in EPIC, still in banking, married, two children   Review of Systems  Constitutional: Negative for fever, chills and fatigue.  Respiratory: Negative for cough and shortness of breath.   Cardiovascular: Negative for chest pain.  Gastrointestinal: Negative for nausea and vomiting.  Musculoskeletal: Negative for arthralgias.       Objective:   Physical Exam Vitals: reviewed Gen: pleasant AAM, NAD HEENT: normocephalic, PERRL, EOMI, no scleral icterus, bilateral TM's pearly grey, nasal septum midline, MMM, uvula midline, no pharyngeal erythema or exudate noted, no thyromegaly, no cervical lymphadenopathy Cardiac: RRR, S1 and S2 present, no murmurs, no heaves/thrills Resp: CTAB, normal effort Abd: soft, no tenderness, no organomegaly, normal bowel sounds Ext: no edema, 2+ radial and DP pulses bilaterally Skin: no suspicious skin lesions  Reviewed labs from one year ago.     Assessment & Plan:  Visit for well man health check Patient returns for annual wellness exam -discussed lifestyle modifications -routine labs obtained -declined flu shot -Tetanus shot provided -not a candidate for Colonoscopy or further prostate CA screening  Thumb pain Pain has resolved.

## 2014-11-07 NOTE — Assessment & Plan Note (Signed)
Pain has resolved.

## 2014-11-11 ENCOUNTER — Encounter: Payer: Self-pay | Admitting: Family Medicine

## 2014-11-11 DIAGNOSIS — E7800 Pure hypercholesterolemia, unspecified: Secondary | ICD-10-CM | POA: Insufficient documentation

## 2015-01-16 ENCOUNTER — Telehealth: Payer: Self-pay | Admitting: Family Medicine

## 2015-01-16 NOTE — Telephone Encounter (Signed)
Patient asks PCP to complete Insurance Form. Please, follow up with Patient.

## 2015-01-21 NOTE — Telephone Encounter (Signed)
Left voice message for patient stating that form was faxed to [phone].  The original copy ready for pick up.  Derl Barrow, RN

## 2015-01-21 NOTE — Telephone Encounter (Signed)
Form completed and gave to Latina Craver RN.

## 2015-01-21 NOTE — Telephone Encounter (Signed)
Form placed in PCP box 

## 2015-03-12 ENCOUNTER — Ambulatory Visit (INDEPENDENT_AMBULATORY_CARE_PROVIDER_SITE_OTHER): Payer: BLUE CROSS/BLUE SHIELD | Admitting: Family Medicine

## 2015-03-12 ENCOUNTER — Encounter: Payer: Self-pay | Admitting: Family Medicine

## 2015-03-12 VITALS — BP 106/64 | HR 97 | Temp 99.6°F | Ht 66.0 in | Wt 155.6 lb

## 2015-03-12 DIAGNOSIS — J069 Acute upper respiratory infection, unspecified: Secondary | ICD-10-CM | POA: Diagnosis not present

## 2015-03-12 MED ORDER — OSELTAMIVIR PHOSPHATE 75 MG PO CAPS: 75.0000 mg | ORAL_CAPSULE | Freq: Two times a day (BID) | ORAL | Status: DC

## 2015-03-12 NOTE — Progress Notes (Signed)
Date of Visit: 03/12/2015   HPI:  Patient presents for a same day appointment to discuss body aches. Began to feel sick within the last 24 hours. Feels achy all over. Also experiencing nasal congestion and chest congestion, as well as headache. No objectively measured temp but has felt febrile. No vomiting or nausea, no diarrhea. Decreased appetite but drinking liquids well. Wife recently had a cold. Patient reports getting a viral illness similar to this about once per year. It does not feel significantly worse than colds he's had in the past. No shortness of breath or lightheadedness.  ROS: See HPI  [place]: previously healthy  PHYSICAL EXAM: BP 106/64 mmHg  Pulse 97  Temp(Src) 99.6 F (37.6 C) (Oral)  Ht 5\' 6"  (1.676 m)  Wt 155 lb 9.6 oz (70.58 kg)  BMI 25.13 kg/m2 Gen: no acute distress, pleasant, cooperative HEENT: normocephalic, atraumatic. moist mucous membranes. Oropharynx clear and moist. Nasal mucosa mildly inflamed. Tympanic membranes clear bilaterally. No nuchal rigidity, full ROM of neck. No significant anterior cervical lymphadenopathy Heart: regular rate and rhythm, no murmur Lungs: clear to auscultation bilaterally, normal work of breathing, no crackles or wheezes Abdomen: soft, nontender to palpation Neuro: grossly nonfocal, speech normal  ASSESSMENT/PLAN:  1. Viral illness - differential diagnosis includes viral URI verus influenza. Discussed options with patient for treatment, including supportive care versus tamiflu (as onset was less than 48 hours ago). Risks and benefits of tamiflu reviewed with patient. He elected to proceed with supportive care with tylenol, ibuprofen, mucinex and hold on tamiflu for now. Printed rx for tamiflu given to patient in case he worsens tomorrow and elects to take it. Advised he can follow up on Friday if significantly worse. Patient agreeable to this plan.  FOLLOW UP: Follow up as needed if symptoms worsen or fail to improve.     [place]. Ardelia Mems, [place]

## 2015-03-12 NOTE — Patient Instructions (Signed)
Likely this is a cold Use mucinex, tylenol, ibuprofen Drink plenty of fluids If you feel a lot worse tomorrow, start the tamiflu  Follow up if still worsening on Friday  Be well, Dr. Ardelia Mems   Influenza, Adult Influenza ("the flu") is a viral infection of the respiratory tract. It occurs more often in winter months because people spend more time in close contact with one another. Influenza can make you feel very sick. Influenza easily spreads from person to person (contagious). CAUSES  Influenza is caused by a virus that infects the respiratory tract. You can catch the virus by breathing in droplets from an infected person's cough or sneeze. You can also catch the virus by touching something that was recently contaminated with the virus and then touching your mouth, nose, or eyes. RISKS AND COMPLICATIONS You may be at risk for a more severe case of influenza if you smoke cigarettes, have diabetes, have chronic heart disease (such as heart failure) or lung disease (such as asthma), or if you have a weakened immune system. Elderly people and pregnant women are also at risk for more serious infections. The most common problem of influenza is a lung infection (pneumonia). Sometimes, this problem can require emergency medical care and may be life threatening. SIGNS AND SYMPTOMS  Symptoms typically last 4 to 10 days and may include:  Fever.  Chills.  Headache, body aches, and muscle aches.  Sore throat.  Chest discomfort and cough.  Poor appetite.  Weakness or feeling tired.  Dizziness.  Nausea or vomiting. DIAGNOSIS  Diagnosis of influenza is often made based on your history and a physical exam. A nose or throat swab test can be done to confirm the diagnosis. TREATMENT  In mild cases, influenza goes away on its own. Treatment is directed at relieving symptoms. For more severe cases, your health care provider may prescribe antiviral medicines to shorten the sickness. Antibiotic  medicines are not effective because the infection is caused by a virus, not by bacteria. HOME CARE INSTRUCTIONS  Take medicines only as directed by your health care provider.  Use a cool mist humidifier to make breathing easier.  Get plenty of rest until your temperature returns to normal. This usually takes 3 to 4 days.  Drink enough fluid to keep your urine clear or pale yellow.  Cover yourmouth and nosewhen coughing or sneezing,and wash your handswellto prevent thevirusfrom spreading.  Stay homefromwork orschool untilthe fever is gonefor at least 57full day. PREVENTION  An annual influenza vaccination (flu shot) is the best way to avoid getting influenza. An annual flu shot is now routinely recommended for all adults in the [place] IF:  You experiencechest pain, yourcough worsens,or you producemore mucus.  Youhave nausea,vomiting, ordiarrhea.  Your fever returns or gets worse. SEEK IMMEDIATE MEDICAL CARE IF:  You havetrouble breathing, you become short of breath,or your skin ornails becomebluish.  You have severe painor stiffnessin the neck.  You develop a sudden headache, or pain in the face or ear.  You have nausea or vomiting that you cannot control. MAKE SURE YOU:   Understand these instructions.  Will watch your condition.  Will get help right away if you are not doing well or get worse.   This information is not intended to replace advice given to you by your health care provider. Make sure you discuss any questions you have with your health care provider.   Document Released: 01/16/2000 Document Revised: 02/08/2014 Document Reviewed: 04/19/2011 Elsevier Interactive Patient Education  2016 [employer].

## 2015-03-17 ENCOUNTER — Encounter: Payer: Self-pay | Admitting: Family Medicine

## 2015-03-17 ENCOUNTER — Ambulatory Visit (INDEPENDENT_AMBULATORY_CARE_PROVIDER_SITE_OTHER): Payer: BLUE CROSS/BLUE SHIELD | Admitting: Family Medicine

## 2015-03-17 VITALS — BP 134/99 | HR 94 | Temp 99.3°F | Wt 157.5 lb

## 2015-03-17 DIAGNOSIS — J069 Acute upper respiratory infection, unspecified: Secondary | ICD-10-CM

## 2015-03-17 MED ORDER — CETIRIZINE HCL 10 MG PO TABS: 10.0000 mg | ORAL_TABLET | Freq: Every day | ORAL | Status: DC

## 2015-03-17 MED ORDER — FLUTICASONE PROPIONATE 50 MCG/ACT NA SUSP: 2.0000 | Freq: Every day | NASAL | Status: DC

## 2015-03-17 NOTE — Patient Instructions (Signed)
Take zyrtec and flonase for nasal congestion Come back later this week if you're not getting better -would do labs at that point  Be well, Dr. Ardelia [name]  Upper Respiratory Infection, Adult Most upper respiratory infections (URIs) are a viral infection of the air passages leading to the lungs. A URI affects the nose, throat, and upper air passages. The most common type of URI is nasopharyngitis and is typically referred to as "the common cold." URIs run their course and usually go away on their own. Most of the time, a URI does not require medical attention, but sometimes a bacterial infection in the upper airways can follow a viral infection. This is called a secondary infection. Sinus and middle ear infections are common types of secondary upper respiratory infections. Bacterial pneumonia can also complicate a URI. A URI can worsen asthma and chronic obstructive pulmonary disease (COPD). Sometimes, these complications can require emergency medical care and may be life threatening.  CAUSES Almost all URIs are caused by viruses. A virus is a type of germ and can spread from one person to another.  RISKS FACTORS You may be at risk for a URI if:   You smoke.   You have chronic heart or lung disease.  You have a weakened defense (immune) system.   You are very young or very old.   You have nasal allergies or asthma.  You work in crowded or poorly ventilated areas.  You work in health care facilities or schools. SIGNS AND SYMPTOMS  Symptoms typically develop 2-3 days after you come in contact with a cold virus. Most viral URIs last 7-10 days. However, viral URIs from the influenza virus (flu virus) can last 14-18 days and are typically more severe. Symptoms may include:   Runny or stuffy (congested) nose.   Sneezing.   Cough.   Sore throat.   Headache.   Fatigue.   Fever.   Loss of appetite.   Pain in your forehead, behind your eyes, and over your cheekbones (sinus  pain).  Muscle aches.  DIAGNOSIS  Your health care provider may diagnose a URI by:  Physical exam.  Tests to check that your symptoms are not due to another condition such as:  Strep throat.  Sinusitis.  Pneumonia.  Asthma. TREATMENT  A URI goes away on its own with time. It cannot be cured with medicines, but medicines may be prescribed or recommended to relieve symptoms. Medicines may help:  Reduce your fever.  Reduce your cough.  Relieve nasal congestion. HOME CARE INSTRUCTIONS   Take medicines only as directed by your health care provider.   Gargle warm saltwater or take cough drops to comfort your throat as directed by your health care provider.  Use a warm mist humidifier or inhale steam from a shower to increase air moisture. This may make it easier to breathe.  Drink enough fluid to keep your urine clear or pale yellow.   Eat soups and other clear broths and maintain good nutrition.   Rest as needed.   Return to work when your temperature has returned to normal or as your health care provider advises. You may need to stay home longer to avoid infecting others. You can also use a face mask and careful hand washing to prevent spread of the virus.  Increase the usage of your inhaler if you have asthma.   Do not use any tobacco products, including cigarettes, chewing tobacco, or electronic cigarettes. If you need help quitting, ask your health care  provider. PREVENTION  The best way to protect yourself from getting a cold is to practice good hygiene.   Avoid oral or hand contact with people with cold symptoms.   Wash your hands often if contact occurs.  There is no clear evidence that vitamin C, vitamin E, echinacea, or exercise reduces the chance of developing a cold. However, it is always recommended to get plenty of rest, exercise, and practice good nutrition.  SEEK MEDICAL CARE IF:   You are getting worse rather than better.   Your symptoms are  not controlled by medicine.   You have chills.  You have worsening shortness of breath.  You have brown or red mucus.  You have yellow or brown nasal discharge.  You have pain in your face, especially when you bend forward.  You have a fever.  You have swollen neck glands.  You have pain while swallowing.  You have white areas in the back of your throat. SEEK IMMEDIATE MEDICAL CARE IF:   You have severe or persistent:  Headache.  Ear pain.  Sinus pain.  Chest pain.  You have chronic lung disease and any of the following:  Wheezing.  Prolonged cough.  Coughing up blood.  A change in your usual mucus.  You have a stiff neck.  You have changes in your:  Vision.  Hearing.  Thinking.  Mood. MAKE SURE YOU:   Understand these instructions.  Will watch your condition.  Will get help right away if you are not doing well or get worse.   This information is not intended to replace advice given to you by your health care provider. Make sure you discuss any questions you have with your health care provider.   Document Released: 07/14/2000 Document Revised: 06/04/2014 Document Reviewed: 04/25/2013 Elsevier Interactive Patient Education [employer].

## 2015-03-17 NOTE — Progress Notes (Signed)
Date of Visit: 03/17/2015   HPI:  Patient presents for follow up of his viral URI. Was seen by me last week on 2/8 and dx'd with likely viral URI, but given rx for tamiflu in case things got worse as he presented within the first 24 hours of illness. Patient reports that Thursday/Fri he felt some better, but Saturday felt worse. Sunday felt better untilt he evening, which is when started feeling bad again. Endorsing body aches, tension headache, and low energy. Yesterday nasal congestion became particularly annoying, nose is constantly running. Endorses mild cough. Eating and drinking well. Stooling and urinating normally. Had low grade temp at home on Wednesday of last week but has not checked temp at home. Thinks possibly may have had fever since then. Using ibuprofen for headahce, which he describes as being a "tension headache" in bilateral temples. Ibuprofen helps the headache and the body aches. No sore throat. Cough is nonproductive. No abdominal pain. No neck stiffness or pain. No confusion or speech changes. He did not get the tamiflu filled. Did not get mucinex, only medication he's taking is ibuprofen. He reports this episode is going on longer than his typical yearly URI and thus he wanted to get checked out again.  ROS: See HPI.  [place]: previously healthy  PHYSICAL EXAM: BP 134/99 mmHg  Pulse 94  Temp(Src) 99.3 F (37.4 C) (Oral)  Wt 157 lb 8 oz (71.442 kg) Gen: NAD, pleasant, cooperative, well appearing HEENT: normocephalic, atraumatic. Nares with erythematous turbinates. No sinus tenderness. Oropharynx clear and moist. Tympanic membranes clear bilaterally. No anterior cervical or supraclavicular lymphadenopathy. No meningeal signs Heart: regular rate and rhythm, no murmur Lungs: clear to auscultation bilaterally, normal work of breathing, no crackles or wheezes Neuro: alert, grossly nonfocal, speech normal Abdomen: soft, nontender to palpation  Ext: atraumatic. Arms and leg  musculature nontender to palpation.  ASSESSMENT/PLAN:  1. Viral URI - symptoms remain consistent with viral URI, on day 7 of illness. No utility to starting tamiflu at this point. No signs of bacterial infection on exam. Recommend supportive care with zyrtec and flonase. rx sent in. Encouraged patient to follow up later this week if he is still feeling sick. At that point would obtain labwork versus consider empiric antibiotic therapy for sinusitis.  FOLLOW UP: Follow up as needed if symptoms worsen or fail to improve.    [place]. Ardelia Mems, [place]

## 2015-11-26 NOTE — Progress Notes (Signed)
45 y.o. year old male presents for preventative visit and annual physical.   Acute Concerns:  Diet:  Exercise:  Sexual History:  POA/Living Will:  Social:  Social History   Social History  . Marital status: Married    Spouse name: N/A  . Number of children: 1  . Years of education: 44   Occupational History  .  [employer]   Social History Main Topics  . Smoking status: Never Smoker  . Smokeless tobacco: Not on file  . Alcohol use No  . Drug use: Unknown  . Sexual activity: Yes    Partners: Female   Other Topics Concern  . Not on file   Social History Narrative   BA Finance   Employed by [employer] [place]   Married, two children                   Immunization: Immunization History  Administered Date(s) Administered  . Td 08/01/2004  . Tdap 11/07/2014  Due for flu vaccine  Cancer Screening:  Colonoscopy: No FH of colon cancer  Prostate: No FM of prostate CA     Physical Exam: VITALS: Reviewed GEN: Pleasant male, NAD HEENT: Normocephalic, PERRL, EOMI, no scleral icterus, bilateral TM pearly grey, nasal septum midline, MMM, uvula midline, no anterior or posterior lymphadenopathy, no thyromegaly CARDIAC:RRR, S1 and S2 present, no murmur, no heaves/thrills RESP: CTAB, normal effort ABD: soft, no tenderness, normal bowel sounds  EXT: No edema, 2+ radial and DP pulses SKIN: no rash  ASSESSMENT & PLAN: 45 y.o. male presents for annual preventative exam. Please see problem specific assessment and plan.   No problem-specific Assessment & Plan notes found for this encounter.

## 2015-11-28 ENCOUNTER — Ambulatory Visit (INDEPENDENT_AMBULATORY_CARE_PROVIDER_SITE_OTHER): Payer: BLUE CROSS/BLUE SHIELD | Admitting: Family Medicine

## 2015-11-28 DIAGNOSIS — E7800 Pure hypercholesterolemia, unspecified: Secondary | ICD-10-CM

## 2015-11-29 NOTE — Progress Notes (Signed)
Encounter opened in error for precharting. Patient did not come for appointment.

## 2015-12-10 ENCOUNTER — Encounter: Payer: Self-pay | Admitting: Family Medicine

## 2015-12-10 NOTE — Progress Notes (Signed)
45 y.o. year old male presents for preventative visit and annual physical.   Acute Concerns: Denies any acute concerns.   Diet:  Breakfast: Fruit and Peanuts.  Lunch: Peanut butter and jelly sandwich, occasional chicken sandwich with french fries and soda Dinner: Vegetables and Green tea  Exercise:  Performs 20 pushups every night. Occasional walking (once or twice/month) limited by busy schedule and watching his children.  Talked about increasing frequency of aerobic exercise to help lower his cholesterol.  Sexual History: No issues in the bedroom. Reports healthy erections. Married/monogamous. On and off protected and unprotected sexual intercourse with spouse.   POA/Living Will: Interested in learning more about POA. Pamphlet given to patient as additional resource.  Social:  Social History   Social History  . Marital status: Married    Spouse name: N/A  . Number of children: 1  . Years of education: 42   Occupational History  .  [employer]   Social History Main Topics  . Smoking status: Never Smoker  . Smokeless tobacco: None  . Alcohol use No  . Drug use: Unknown  . Sexual activity: Yes    Partners: Female   Other Topics Concern  . 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 Td 08/01/2004  . Tdap 11/07/2014  Due for flu vaccine. Patient refused flu vaccine today.   Cancer Screening:  Colonoscopy: No FH of colon cancer. No blood in stool.  Prostate: No FH of prostate CA. No urinary symptoms today  ROS: Gen: Negative for fatigue or weight loss.  Neuro: Negative for weakness, HA, syncope, numbness or tingling.  CV: Negative for chest pain, SOB, orthopnea, dyspnea.  Resp: Negative for cough, wheezing, dyspnea GI: Negative for abdominal pain. Negative for bloody stool. Negative for constipation and  diarrhea.  GU: Negative for dysuria, hesistancy, hematuria Endo: Negative for polyuria.   Physical Exam: VITALS: Reviewed GEN: Pleasant male, NAD. HEENT: Normocephalic, PERRL, EOMI, no scleral icterus, bilateral TM pearly grey, nasal septum midline, MMM, uvula midline, no anterior or posterior lymphadenopathy, no thyromegaly CARDIAC:RRR, S1 and S2 present, no murmur, no heaves/thrills RESP: Lungs CTAB, normal effort ABD: Soft, no tenderness, normal bowel sounds. No rebound or guarding.  EXT: No edema, 2+ radial and DP pulses SKIN: no rash  ASSESSMENT & PLAN: 45 y.o. male presents for annual preventative exam. Please see problem specific assessment and plan.   Preventative health care 45 y/o male presents for annual preventative visit. - Up to date on vaccine other flu vaccine which he declined. - Not a candidate for colon or prostate cancer screening at this time - Discussed lifestyle modifications - Basic labs obtained including lipid profile/CBC/BMP

## 2015-12-11 ENCOUNTER — Ambulatory Visit (INDEPENDENT_AMBULATORY_CARE_PROVIDER_SITE_OTHER): Payer: BLUE CROSS/BLUE SHIELD | Admitting: Family Medicine

## 2015-12-11 ENCOUNTER — Telehealth: Payer: Self-pay | Admitting: Family Medicine

## 2015-12-11 ENCOUNTER — Encounter: Payer: Self-pay | Admitting: Family Medicine

## 2015-12-11 DIAGNOSIS — E7800 Pure hypercholesterolemia, unspecified: Secondary | ICD-10-CM

## 2015-12-11 DIAGNOSIS — Z0000 Encounter for general adult medical examination without abnormal findings: Secondary | ICD-10-CM | POA: Diagnosis not present

## 2015-12-11 LAB — COMPLETE METABOLIC PANEL WITH GFR
ALT: 18 U/L (ref 9–46)
AST: 24 U/L (ref 10–40)
Albumin: 4.9 g/dL (ref 3.6–5.1)
Alkaline Phosphatase: 46 U/L (ref 40–115)
BUN: 11 mg/dL (ref 7–25)
CHLORIDE: 102 mmol/L (ref 98–110)
CO2: 26 mmol/L (ref 20–31)
CREATININE: 0.87 mg/dL (ref 0.60–1.35)
Calcium: 9.9 mg/dL (ref 8.6–10.3)
GFR, Est Non African American: >89 mL/min (ref >=60)
GLUCOSE: 73 mg/dL (ref 65–99)
Potassium: 4.2 mmol/L (ref 3.5–5.3)
SODIUM: 137 mmol/L (ref 135–146)
Total Bilirubin: 0.8 mg/dL (ref 0.2–1.2)
Total Protein: 7.7 g/dL (ref 6.1–8.1)

## 2015-12-11 LAB — CBC
HEMATOCRIT: 43.2 % (ref 38.5–50.0)
Hemoglobin: 14.6 g/dL (ref 13.2–17.1)
MCH: 30.4 pg (ref 27.0–33.0)
MCHC: 33.8 g/dL (ref 32.0–36.0)
MCV: 90 fL (ref 80.0–100.0)
MPV: 10.3 fL (ref 7.5–12.5)
Platelets: 264 10*3/uL (ref 140–400)
RBC: 4.8 MIL/uL (ref 4.20–5.80)
RDW: 12.9 % (ref 11.0–15.0)
WBC: 5.4 10*3/uL (ref 3.8–10.8)

## 2015-12-11 LAB — LIPID PANEL
Cholesterol: 214 mg/dL — ABNORMAL HIGH (ref <200)
HDL: 56 mg/dL (ref >40)
LDL CALC: 134 mg/dL — AB
Total CHOL/HDL Ratio: 3.8 Ratio (ref <5.0)
Triglycerides: 121 mg/dL (ref <150)
VLDL: 24 mg/dL (ref <30)

## 2015-12-11 NOTE — Telephone Encounter (Signed)
Wor form dropped off for at front desk for completion.  Verified that patient section of form has been completed.  Last DOS with PCP was 12-11-2015.  Placed form in team folder to be completed by clinical staff.  Rosa A Charlies Constable

## 2015-12-11 NOTE — Assessment & Plan Note (Signed)
45 y/o male presents for annual preventative visit. - Up to date on vaccine other flu vaccine which he declined. - Not a candidate for colon or prostate cancer screening at this time - Discussed lifestyle modifications - Basic labs obtained including lipid profile/CBC/BMP

## 2015-12-11 NOTE — Telephone Encounter (Signed)
Form placed in PCP box 

## 2015-12-11 NOTE — Patient Instructions (Signed)
It was nice to see you today.  Dr. Ree Kida will call you with your lab results.

## 2015-12-12 ENCOUNTER — Telehealth: Payer: Self-pay | Admitting: Family Medicine

## 2015-12-12 DIAGNOSIS — E7800 Pure hypercholesterolemia, unspecified: Secondary | ICD-10-CM

## 2015-12-12 NOTE — Telephone Encounter (Signed)
Called patient to discuss lab results. CMP and CBC in normal range. ASCVD calculated at 4.2 percent. No medication started.

## 2015-12-12 NOTE — Telephone Encounter (Signed)
Form completed and returned to [employer] RN.

## 2015-12-15 NOTE — Telephone Encounter (Signed)
Left voice message for patient that form was faxed to [phone].  Original copy placed up front for pick up.  Derl Barrow, RN

## 2016-10-27 ENCOUNTER — Ambulatory Visit (INDEPENDENT_AMBULATORY_CARE_PROVIDER_SITE_OTHER): Payer: BLUE CROSS/BLUE SHIELD | Admitting: Internal Medicine

## 2016-10-27 ENCOUNTER — Encounter: Payer: Self-pay | Admitting: Internal Medicine

## 2016-10-27 VITALS — BP 122/72 | HR 72 | Temp 98.3°F | Ht 66.0 in | Wt 160.0 lb

## 2016-10-27 DIAGNOSIS — Z0000 Encounter for general adult medical examination without abnormal findings: Secondary | ICD-10-CM

## 2016-10-27 DIAGNOSIS — Z23 Encounter for immunization: Secondary | ICD-10-CM

## 2016-10-27 NOTE — Patient Instructions (Addendum)
I think you are doing well. I don't have any concerns. you can follow-up in one year for your next physical or if you have any concerns follow-up earlier.

## 2016-10-27 NOTE — Progress Notes (Signed)
   [name] is a 46 y.o. male presents to office today for annual physical exam examination.  Concerns today include:  1. No issues   Last eye exam: no issues  Immunizations needed: flu  Refills needed today: none  Sexual activity: Married, monogomas, no STD concerns  Exercise: Walk about 5 days a week  Diet: Breakfast: eat a lot of cereal: lunch: sandwiches, vegetable, fruits, Granola bars, Almonds Dinner: chicken and vegetables, Mac and cheese. Eat a lot of sweets  Smoking: none  Alcohol: none  Drugs: none  Mood: No mood concerns, no depression or anxiety, SI  Advance Directives: As information available for this issues     Past Medical History:  Diagnosis Date  . Seborrhea 01/04/2012  . Tinea pedis 01/04/2012   Social History   Social History  . Marital status: Married    Spouse name: N/A  . Number of children: 1  . Years of education: 23   Occupational History  .  [employer]   Social History Main Topics  . Smoking status: Never Smoker  . Smokeless tobacco: Never Used  . Alcohol use No  . Drug use: Unknown  . Sexual activity: Yes    Partners: Female   Other Topics Concern  . Not on file   Social History Narrative   BA Finance   Employed by [employer] [place]   Married, two children                  No past surgical history on file. Family History  Problem Relation Age of Onset  . Hypertension Mother   . Cancer Father 57       Multiple myeloma  . Hypertension Father     ROS: Review of Systems Review of Systems  All other systems reviewed and are negative. Patient reports no  vision/ hearing changes,anorexia, weight change, fever ,adenopathy, persistant / recurrent hoarseness, swallowing issues, chest pain, edema,persistant / recurrent cough, hemoptysis, dyspnea(rest, exertional, paroxysmal nocturnal), gastrointestinal  bleeding (melena, rectal bleeding), abdominal pain, excessive heart burn, GU symptoms(dysuria, hematuria, pyuria,  voiding/incontinence  Issues) syncope, focal weakness, severe memory loss, concerning skin lesions, depression, anxiety, abnormal bruising/bleeding, major joint swelling.     Physical exam Physical Exam  Constitutional: He is oriented to person, place, and time. He appears well-developed and well-nourished.  HENT:  Head: Normocephalic and atraumatic.  Eyes: Pupils are equal, round, and reactive to light. Conjunctivae are normal.  Neck: Normal range of motion. Neck supple.  Cardiovascular: Normal rate and regular rhythm.   Pulmonary/Chest: Effort normal and breath sounds normal.  Abdominal: Soft. Bowel sounds are normal.  Musculoskeletal: Normal range of motion.  Neurological: He is alert and oriented to person, place, and time.  Skin: Skin is warm. Capillary refill takes less than 2 seconds.     Assessment/ Plan: Patient with here for annual physical exam.   Preventative health care Well man no issues -obtain flu shot today  -discussed nutrition -Does not want any screening labs at this time - Follow up in one year    [place] PGY-2, [place]

## 2016-11-01 NOTE — Assessment & Plan Note (Signed)
Well man no issues -obtain flu shot today  -discussed nutrition -Does not want any screening labs at this time - Follow up in one year

## 2017-01-21 ENCOUNTER — Encounter: Payer: Self-pay | Admitting: Family Medicine

## 2017-01-21 ENCOUNTER — Ambulatory Visit: Payer: BLUE CROSS/BLUE SHIELD | Admitting: Family Medicine

## 2017-01-21 ENCOUNTER — Other Ambulatory Visit: Payer: Self-pay

## 2017-01-21 DIAGNOSIS — E7800 Pure hypercholesterolemia, unspecified: Secondary | ICD-10-CM

## 2017-01-21 NOTE — Patient Instructions (Signed)
On your way out, schedule an appointment one morning to come back for fasting labs. Do not eat or drink anything other than water the morning of your lab appointment until after your labs are drawn.   We will call you when the form is ready  Be well, Dr. Ardelia Mems

## 2017-01-21 NOTE — Progress Notes (Signed)
Date of Visit: 01/21/2017   HPI:  Patient presents to have form completed for his insurance and to have cholesterol checked.  Needs lipids checked, also waist circumference measured and BP measured Denies having any other questions or concerns today He is not fasting today but can return for a fasting visit Does not take any medications. No chronic medical problems, does have history of prior elevated cholesterol levels in the past, managed just with lifestyle interventions. He already had a routine preventive visit a few months ago.  ROS: See HPI.  [place]: history of hyperlipidemia, eczema  PHYSICAL EXAM: BP 102/68   Pulse 80   Temp 97.7 F (36.5 C) (Oral)   Wt 164 lb 6.4 oz (74.6 kg)   SpO2 98%   BMI 26.53 kg/m  Gen: no acute distress, pleasant, cooperative HEENT: normocephalic, atraumatic  Heart: regular rate and rhythm, no murmur Lungs: clear to auscultation bilaterally, normal work of breathing  Neuro: grossly nonfocal, speech normal  Waist circumference: 37.5 inches  ASSESSMENT/PLAN:  Health maintenance:  -current on all health maintenance items  Pure hypercholesterolemia Patient to schedule fasting lab appointment to check lipids next week since he is not fasting today. I will keep his form for his insurance in my inbox & complete it when labs have returned & we'll call him & let him know to come pick up the form  FOLLOW UP: Follow up next week for fasting lab visit  [place] J. Ardelia Mems, [place]

## 2017-01-21 NOTE — Assessment & Plan Note (Signed)
Patient to schedule fasting lab appointment to check lipids next week since he is not fasting today. I will keep his form for his insurance in my inbox & complete it when labs have returned & we'll call him & let him know to come pick up the form

## 2017-01-26 ENCOUNTER — Other Ambulatory Visit: Payer: BLUE CROSS/BLUE SHIELD

## 2017-01-26 DIAGNOSIS — E7800 Pure hypercholesterolemia, unspecified: Secondary | ICD-10-CM | POA: Diagnosis not present

## 2017-01-27 LAB — CMP14+EGFR
A/G RATIO: 1.7 (ref 1.2–2.2)
ALBUMIN: 4.5 g/dL (ref 3.5–5.5)
ALK PHOS: 57 IU/L (ref 39–117)
ALT: 21 IU/L (ref 0–44)
AST: 25 IU/L (ref 0–40)
BUN / CREAT RATIO: 16 (ref 9–20)
BUN: 15 mg/dL (ref 6–24)
Bilirubin Total: 0.5 mg/dL (ref 0.0–1.2)
CALCIUM: 9.6 mg/dL (ref 8.7–10.2)
CO2: 25 mmol/L (ref 20–29)
CREATININE: 0.96 mg/dL (ref 0.76–1.27)
Chloride: 103 mmol/L (ref 96–106)
GFR calc Af Amer: 109 mL/min/{1.73_m2} (ref >59)
GFR, EST NON AFRICAN AMERICAN: 94 mL/min/{1.73_m2} (ref >59)
GLOBULIN, TOTAL: 2.7 g/dL (ref 1.5–4.5)
Glucose: 80 mg/dL (ref 65–99)
POTASSIUM: 4.4 mmol/L (ref 3.5–5.2)
SODIUM: 141 mmol/L (ref 134–144)
Total Protein: 7.2 g/dL (ref 6.0–8.5)

## 2017-01-27 LAB — LIPID PANEL
CHOL/HDL RATIO: 4.2 ratio (ref 0.0–5.0)
Cholesterol, Total: 212 mg/dL — ABNORMAL HIGH (ref 100–199)
HDL: 51 mg/dL (ref >39)
LDL CALC: 143 mg/dL — AB (ref 0–99)
TRIGLYCERIDES: 92 mg/dL (ref 0–149)
VLDL Cholesterol Cal: 18 mg/dL (ref 5–40)

## 2017-02-08 ENCOUNTER — Encounter: Payer: Self-pay | Admitting: Family Medicine

## 2017-02-08 NOTE — Telephone Encounter (Signed)
Called patient to discuss & confirm he wants the form faxed to the # provided on the form [phone]). He confirms this. Will fax it & scan copy into chart for record keeping purposes.  Reviewed cholesterol results with him. No need for statin at present, but should continue diet/exercise management.   Patient appreciative.  Leeanne Rio, MD

## 2018-11-03 ENCOUNTER — Encounter: Payer: BLUE CROSS/BLUE SHIELD | Admitting: Family Medicine

## 2018-11-28 ENCOUNTER — Ambulatory Visit (INDEPENDENT_AMBULATORY_CARE_PROVIDER_SITE_OTHER): Payer: Medicaid Other | Admitting: Family Medicine

## 2018-11-28 ENCOUNTER — Other Ambulatory Visit: Payer: Self-pay

## 2018-11-28 VITALS — BP 115/70 | HR 72 | Ht 66.0 in | Wt 161.0 lb

## 2018-11-28 DIAGNOSIS — E7800 Pure hypercholesterolemia, unspecified: Secondary | ICD-10-CM

## 2018-11-28 DIAGNOSIS — Z0000 Encounter for general adult medical examination without abnormal findings: Secondary | ICD-10-CM | POA: Diagnosis present

## 2018-11-28 DIAGNOSIS — Z23 Encounter for immunization: Secondary | ICD-10-CM | POA: Diagnosis not present

## 2018-11-28 NOTE — Progress Notes (Signed)
Date of Visit: 11/28/2018   HPI:  Patient presents today for a well adult male exam.   Concerns today: none Sexual activity: yes, women (1 partner, married), uses protection STD Screening: declines this today Exercise: walking 45 min, 4-6 times per week   Diet: well balanced diet Smoking: none (never smoker) Alcohol: none  Drugs: none Advance directives: has not completed, would like more information today Mood: stable, no concerns, no symptoms of anxiety or depression reported  ROS: Negative ROS. No vision, hearing changes. Denies fever, cough, fatigue, dyspnea, chest pain, abdominal pain, weakness, constipation, diarrhea.   PMFSH: No active medical diagnoses. Not taking medications currently.   Cancers in family:  Father - multiple myeloma at age of 67; treated successfully  Additional Family History:  Father- diabetes; hypertension  Mother - hypertension   Social History        Social History  . Marital status: Married       . Number of children: 2 (boy - 10 y/o; girl - 8 y/o)  . Years of education: 16       Occupational History  .  [employer]    PHYSICAL EXAM: BP 115/70   Pulse 72   Ht 5' 6" (1.676 m)   Wt 161 lb (73 kg)   SpO2 100%   BMI 25.99 kg/m  Gen: NAD, pleasant, cooperative HEENT: NCAT, no palpable thyromegaly or anterior cervical lymphadenopathy Heart: RRR, no murmurs Lungs: CTAB, NWOB Abdomen: soft, nontender to palpation Neuro: grossly nonfocal, speech normal Extremities: No appreciable lower extremity edema bilaterally   ASSESSMENT/PLAN:  Health maintenance:  -STD screening: not interested in screening at this time -immunizations: received flu vaccine during encounter -lipid screening: lipid panel obtained during today's visit along with CMET -colonoscopy: begin at age 50, not due yet -advance directives: interested, additional information provided during encounter   -handout given on health maintenance topics  FOLLOW  UP: Follow up in 1 year for routine well adult exam   Nandan Thakkar, Medical Student  Patient seen along with MS3 student Nandan Thakkar. I personally evaluated this patient along with the student, and verified all aspects of the history, physical exam, and medical decision making as documented by the student. I agree with the student's documentation and have made all necessary edits.  Brittany McIntyre, MD [facility]   

## 2018-11-28 NOTE — Patient Instructions (Addendum)

## 2018-11-29 LAB — CMP14+EGFR
ALT: 14 IU/L (ref 0–44)
AST: 18 IU/L (ref 0–40)
Albumin/Globulin Ratio: 1.9 (ref 1.2–2.2)
Albumin: 4.8 g/dL (ref 4.0–5.0)
Alkaline Phosphatase: 60 IU/L (ref 39–117)
BUN/Creatinine Ratio: 17 (ref 9–20)
BUN: 16 mg/dL (ref 6–24)
Bilirubin Total: 0.6 mg/dL (ref 0.0–1.2)
CO2: 24 mmol/L (ref 20–29)
Calcium: 9.7 mg/dL (ref 8.7–10.2)
Chloride: 101 mmol/L (ref 96–106)
Creatinine, Ser: 0.95 mg/dL (ref 0.76–1.27)
GFR calc Af Amer: 109 mL/min/{1.73_m2} (ref >59)
GFR calc non Af Amer: 94 mL/min/{1.73_m2} (ref >59)
Globulin, Total: 2.5 g/dL (ref 1.5–4.5)
Glucose: 83 mg/dL (ref 65–99)
Potassium: 4.3 mmol/L (ref 3.5–5.2)
Sodium: 139 mmol/L (ref 134–144)
Total Protein: 7.3 g/dL (ref 6.0–8.5)

## 2018-11-29 LAB — LIPID PANEL
Chol/HDL Ratio: 4.2 ratio (ref 0.0–5.0)
Cholesterol, Total: 229 mg/dL — ABNORMAL HIGH (ref 100–199)
HDL: 55 mg/dL (ref >39)
LDL Chol Calc (NIH): 162 mg/dL — ABNORMAL HIGH (ref 0–99)
Triglycerides: 70 mg/dL (ref 0–149)
VLDL Cholesterol Cal: 12 mg/dL (ref 5–40)

## 2018-11-30 ENCOUNTER — Encounter: Payer: Self-pay | Admitting: Family Medicine

## 2019-05-05 ENCOUNTER — Ambulatory Visit: Payer: Medicaid Other | Attending: Internal Medicine

## 2019-05-05 DIAGNOSIS — Z23 Encounter for immunization: Secondary | ICD-10-CM

## 2019-05-05 NOTE — Progress Notes (Signed)
   Covid-19 Vaccination Clinic  Name:  [name]    MRN: [id number] DOB: February 03, 1970  05/05/2019  Mr. [name] was observed post Covid-19 immunization for 15 minutes without incident. He was provided with Vaccine Information Sheet and instruction to access the V-Safe system.   Mr. [name] was instructed to call 911 with any severe reactions post vaccine: Marland Kitchen Difficulty breathing  . Swelling of face and throat  . A fast heartbeat  . A bad rash all over body  . Dizziness and weakness   Immunizations Administered    Name Date Dose VIS Date Route   Pfizer COVID-19 Vaccine 05/05/2019 10:57 AM 0.3 mL 01/12/2019 Intramuscular   Manufacturer: [place]   Lot: [id number]   [place]: [id number]

## 2019-05-29 ENCOUNTER — Ambulatory Visit: Payer: Medicaid Other | Attending: Internal Medicine

## 2019-05-29 DIAGNOSIS — Z23 Encounter for immunization: Secondary | ICD-10-CM

## 2019-05-29 NOTE — Progress Notes (Signed)
   Covid-19 Vaccination Clinic  Name:  JAYCIEON VALDERRAMA    MRN: [id number] DOB: 1970/05/07  05/29/2019  Mr. Eagleson was observed post Covid-19 immunization for 15 minutes without incident. He was provided with Vaccine Information Sheet and instruction to access the V-Safe system.   Mr. Hendrie was instructed to call 911 with any severe reactions post vaccine: Marland Kitchen Difficulty breathing  . Swelling of face and throat  . A fast heartbeat  . A bad rash all over body  . Dizziness and weakness   Immunizations Administered    Name Date Dose VIS Date Route   Pfizer COVID-19 Vaccine 05/29/2019 10:21 AM 0.3 mL 03/28/2018 Intramuscular   Manufacturer: [place]   Lot: [id number]   [place]: [id number]

## 2019-09-20 ENCOUNTER — Other Ambulatory Visit: Payer: Self-pay

## 2019-09-20 ENCOUNTER — Emergency Department (HOSPITAL_COMMUNITY)
Admission: EM | Admit: 2019-09-20 | Discharge: 2019-09-20 | Disposition: A | Discharge status: Home / Self Care | Payer: Self-pay | Attending: Emergency Medicine | Admitting: Emergency Medicine

## 2019-09-20 ENCOUNTER — Encounter (HOSPITAL_COMMUNITY): Payer: Self-pay

## 2019-09-20 ENCOUNTER — Emergency Department (HOSPITAL_COMMUNITY): Payer: Self-pay

## 2019-09-20 DIAGNOSIS — R000 Tachycardia, unspecified: Secondary | ICD-10-CM | POA: Insufficient documentation

## 2019-09-20 DIAGNOSIS — R339 Retention of urine, unspecified: Secondary | ICD-10-CM | POA: Insufficient documentation

## 2019-09-20 DIAGNOSIS — R338 Other retention of urine: Secondary | ICD-10-CM

## 2019-09-20 LAB — CBC WITH DIFFERENTIAL/PLATELET
Abs Immature Granulocytes: 0.02 10*3/uL (ref 0.00–0.07)
Basophils Absolute: 0 10*3/uL (ref 0.0–0.1)
Basophils Relative: 0 %
Eosinophils Absolute: 0 10*3/uL (ref 0.0–0.5)
Eosinophils Relative: 0 %
HCT: 46.1 % (ref 39.0–52.0)
Hemoglobin: 15.5 g/dL (ref 13.0–17.0)
Immature Granulocytes: 0 %
Lymphocytes Relative: 9 %
Lymphs Abs: 1 10*3/uL (ref 0.7–4.0)
MCH: 30.1 pg (ref 26.0–34.0)
MCHC: 33.6 g/dL (ref 30.0–36.0)
MCV: 89.5 fL (ref 80.0–100.0)
Monocytes Absolute: 0.9 10*3/uL (ref 0.1–1.0)
Monocytes Relative: 8 %
Neutro Abs: 9.6 10*3/uL — ABNORMAL HIGH (ref 1.7–7.7)
Neutrophils Relative %: 83 %
Platelets: 310 10*3/uL (ref 150–400)
RBC: 5.15 MIL/uL (ref 4.22–5.81)
RDW: 11.9 % (ref 11.5–15.5)
WBC: 11.5 10*3/uL — ABNORMAL HIGH (ref 4.0–10.5)
nRBC: 0 % (ref 0.0–0.2)

## 2019-09-20 LAB — COMPREHENSIVE METABOLIC PANEL
ALT: 22 U/L (ref 0–44)
AST: 33 U/L (ref 15–41)
Albumin: 5 g/dL (ref 3.5–5.0)
Alkaline Phosphatase: 46 U/L (ref 38–126)
Anion gap: 13 (ref 5–15)
BUN: 16 mg/dL (ref 6–20)
CO2: 23 mmol/L (ref 22–32)
Calcium: 10 mg/dL (ref 8.9–10.3)
Chloride: 100 mmol/L (ref 98–111)
Creatinine, Ser: 0.89 mg/dL (ref 0.61–1.24)
GFR calc Af Amer: >60 mL/min (ref >60)
GFR calc non Af Amer: >60 mL/min (ref >60)
Glucose, Bld: 95 mg/dL (ref 70–99)
Potassium: 4 mmol/L (ref 3.5–5.1)
Sodium: 136 mmol/L (ref 135–145)
Total Bilirubin: 1.4 mg/dL — ABNORMAL HIGH (ref 0.3–1.2)
Total Protein: 8.7 g/dL — ABNORMAL HIGH (ref 6.5–8.1)

## 2019-09-20 LAB — URINALYSIS, ROUTINE W REFLEX MICROSCOPIC
Bilirubin Urine: NEGATIVE
Glucose, UA: NEGATIVE mg/dL
Hgb urine dipstick: NEGATIVE
Ketones, ur: 20 mg/dL — AB
Leukocytes,Ua: NEGATIVE
Nitrite: POSITIVE — AB
Protein, ur: NEGATIVE mg/dL
Specific Gravity, Urine: 1.02 (ref 1.005–1.030)
pH: 5 (ref 5.0–8.0)

## 2019-09-20 NOTE — Discharge Instructions (Signed)
Make a follow up appointment with [facility]  To care for your catheter:   Wash your hands with soap and water for at least 20 seconds Using mild soap and water, clean your genital area Men should pull back their foreskin, if needed, and clean the area, including the penis. Clean your urethra (urinary opening), which is where the catheter enters your body. Clean the catheter from where it enters your body and then down, away from your body. Hold the catheter at the point it enters your body so that you don't put tension on it. Rinse the area well and dry it gently  Go to [facility] if you are having problems with your catheter, urine is not flowing, or you develop a fever

## 2019-09-20 NOTE — ED Provider Notes (Signed)
[facility] EMERGENCY DEPARTMENT Provider Note   CSN: [id number] Arrival date & time: 09/20/19  8937     History Chief Complaint  Patient presents with  . Abdominal Pain    [name] is a 49 y.o. male who presents with abdominal pain and urinary retention. Pt states that he noticed some difficulty urinating 2 days ago. It has progressively worsened to today where has has developed significant lower abdominal pain over the bladder and cannot urinate much at all. He is still able to pass a very small amount of urine. He denies hx of a similar problem. He has never had to see a urologist and denies any recent urologic procedures. He denies fever, chills, N/V, constipation. He denies flank pain, dysuria, hematuria. He is unaware of any prostate problems. He denies any concern for STD. He is not taking antihistamines. He is taking AZO for his symptoms.   HPI     Past Medical History:  Diagnosis Date  . Seborrhea 01/04/2012  . Tinea pedis 01/04/2012    Patient Active Problem List   Diagnosis Date Noted  . Pure hypercholesterolemia 11/11/2014  . Thumb pain 07/09/2013  . Preventative health care 01/04/2012  . ECZEMA 04/01/2009    History reviewed. No pertinent surgical history.     Family History  Problem Relation Age of Onset  . Hypertension Mother   . Cancer Father 86       Multiple myeloma  . Hypertension Father     Social History   Tobacco Use  . Smoking status: Never Smoker  . Smokeless tobacco: Never Used  Substance Use Topics  . Alcohol use: No  . Drug use: Not on file    Home Medications Prior to Admission medications   Medication Sig Start Date End Date Taking? Authorizing Provider  Pumpkin Seed-Soy Germ (AZO BLADDER CONTROL/GO-LESS PO) Take 1 tablet by mouth 2 (two) times daily as needed (For bladder).   Yes [provider]    Allergies    Patient has no known allergies.  Review of Systems   Review of Systems    Constitutional: Negative for chills and fever.  Gastrointestinal: Positive for abdominal pain. Negative for constipation, diarrhea, nausea and vomiting.  Genitourinary: Positive for difficulty urinating. Negative for dysuria, flank pain, frequency, hematuria, penile pain and testicular pain.  All other systems reviewed and are negative.   Physical Exam Updated Vital Signs BP (!) 149/84   Pulse (!) 118   Temp 98 F (36.7 C) (Oral)   Resp 18   SpO2 99%   Physical Exam Vitals and nursing note reviewed.  Constitutional:      General: He is in acute distress (uncomfortable appearing).     Appearance: Normal appearance. He is well-developed. He is not ill-appearing.  HENT:     Head: Normocephalic and atraumatic.  Eyes:     General: No scleral icterus.       Right eye: No discharge.        Left eye: No discharge.     Conjunctiva/sclera: Conjunctivae normal.     Pupils: Pupils are equal, round, and reactive to light.  Cardiovascular:     Rate and Rhythm: Regular rhythm. Tachycardia present.  Pulmonary:     Effort: Pulmonary effort is normal. No respiratory distress.     Breath sounds: Normal breath sounds.  Abdominal:     General: There is no distension.     Palpations: Abdomen is soft.     Tenderness: There is  abdominal tenderness (suprapubic). There is no right CVA tenderness or left CVA tenderness.  Musculoskeletal:     Cervical back: Normal range of motion.  Skin:    General: Skin is warm and dry.  Neurological:     Mental Status: He is alert and oriented to person, place, and time.  Psychiatric:        Behavior: Behavior normal.     ED Results / Procedures / Treatments   Labs (all labs ordered are listed, but only abnormal results are displayed) Labs Reviewed  CBC WITH DIFFERENTIAL/PLATELET - Abnormal; Notable for the following components:      Result Value   WBC 11.5 (*)    Neutro Abs 9.6 (*)    All other components within normal limits  COMPREHENSIVE  METABOLIC PANEL - Abnormal; Notable for the following components:   Total Protein 8.7 (*)    Total Bilirubin 1.4 (*)    All other components within normal limits  URINALYSIS, ROUTINE W REFLEX MICROSCOPIC - Abnormal; Notable for the following components:   Color, Urine AMBER (*)    Ketones, ur 20 (*)    Nitrite POSITIVE (*)    Bacteria, UA RARE (*)    All other components within normal limits  URINE CULTURE    EKG None  Radiology CT Renal Stone Study  Result Date: 09/20/2019 CLINICAL DATA:  Abdominal pain, dysuria EXAM: CT ABDOMEN AND PELVIS WITHOUT CONTRAST TECHNIQUE: Multidetector CT imaging of the abdomen and pelvis was performed following the standard protocol without IV contrast. COMPARISON:  None. FINDINGS: Lower chest: No acute pleural or parenchymal lung disease. Hepatobiliary: No focal liver abnormality is seen. No gallstones, gallbladder wall thickening, or biliary dilatation. Pancreas: Unremarkable. No pancreatic ductal dilatation or surrounding inflammatory changes. Spleen: Normal in size without focal abnormality. Adrenals/Urinary Tract: Bladder is decompressed with a Foley catheter. There is a punctate calcification along the left aspect of the Foley catheter in the region of the distal prosthetic urethra, reference image 78/3. Urethral calculus cannot be excluded. No urinary tract calculi or obstructive uropathy within either kidney. The adrenals are normal. Stomach/Bowel: No bowel obstruction or ileus. No wall thickening or inflammatory change. Normal gas-filled appendix right lower quadrant. Vascular/Lymphatic: No significant vascular findings are present. No enlarged abdominal or pelvic lymph nodes. Reproductive: Prostate is mildly enlarged measuring 4.4 x 5.5 by 4.6 cm. Other: No free fluid or free gas.  No abdominal wall hernia. Musculoskeletal: No acute or destructive bony lesions. Reconstructed images demonstrate no additional findings. IMPRESSION: 1. Nonspecific punctate less  than 2 mm calcification along the left aspect of the Foley catheter in the distal prosthetic urethra. Urethral calculus cannot be excluded. 2. No other urinary tract calculi or obstructive uropathy. 3. Enlarged prostate. Electronically Signed   By: Randa Ngo M.D.   On: 09/20/2019 19:00    Procedures Procedures (including critical care time)  Medications Ordered in ED Medications - No data to display  ED Course  I have reviewed the triage vital signs and the nursing notes.  Pertinent labs & imaging results that were available during my care of the patient were reviewed by me and considered in my medical decision making (see chart for details).  48 year old who presents with acute urinary retention. Vitals are normal. He is uncomfortable appearing. Will obtain labs, UA, CT renal.  Bladder scan reveals 700cc urine - foley catheter ordered.  Labs are reassuring. UA have positive nitrites but no other signs of infection. Urine culture sent off. CT renal  shows possible punctate urethral calculus and enlarged prostate. Shared visit with Dr. Ralene Bathe. Pt feels much more comfortable on re-evaluation. Advised f/u with urology for voiding trial. He was given foley catheter care instructions and return precautions.   MDM Rules/Calculators/A&P                           Final Clinical Impression(s) / ED Diagnoses Final diagnoses:  Acute urinary retention    Rx / DC Orders ED Discharge Orders    None       Recardo Evangelist, PA-C 09/20/19 1925    Quintella Reichert, MD 09/21/19 1407

## 2019-09-20 NOTE — ED Triage Notes (Signed)
Patient complains of abdominal pain and dysuria and feeling as if cannot empty bladder x 2 days, no hx of same

## 2019-09-20 NOTE — ED Notes (Signed)
Bladder scan volume 702

## 2019-09-22 LAB — URINE CULTURE: Culture: NO GROWTH

## 2019-09-30 ENCOUNTER — Telehealth: Payer: Self-pay | Admitting: Unknown Physician Specialty

## 2019-09-30 ENCOUNTER — Other Ambulatory Visit: Payer: Self-pay | Admitting: Unknown Physician Specialty

## 2019-09-30 DIAGNOSIS — U071 COVID-19: Secondary | ICD-10-CM

## 2019-09-30 DIAGNOSIS — Z609 Problem related to social environment, unspecified: Secondary | ICD-10-CM

## 2019-09-30 NOTE — Telephone Encounter (Signed)
Called to Discuss with patient about Covid symptoms and the use of the monoclonal antibody infusion for those with mild to moderate Covid symptoms and at a high risk of hospitalization.     Pt appears to qualify for this infusion due to co-morbid conditions and/or a member of an at-risk group in accordance with the FDA Emergency Use Authorization.    Unable to reach pt    

## 2019-09-30 NOTE — Telephone Encounter (Signed)
I connected by phone with [name] on 09/30/2019 at 12:51 PM to discuss the potential use of a new treatment for mild to moderate COVID-19 viral infection in non-hospitalized patients.  This patient is a 49 y.o. male that meets the FDA criteria for Emergency Use Authorization of COVID monoclonal antibody casirivimab/imdevimab.  Has a (+) direct SARS-CoV-2 viral test result  Has mild or moderate COVID-19   Is NOT hospitalized due to COVID-19  Is within 10 days of symptom onset  Has at least one of the high risk factor(s) for progression to severe COVID-19 and/or hospitalization as defined in EUA.  Specific high risk criteria : High risk group   I have spoken and communicated the following to the patient or parent/caregiver regarding COVID monoclonal antibody treatment:  1. FDA has authorized the emergency use for the treatment of mild to moderate COVID-19 in adults and pediatric patients with positive results of direct SARS-CoV-2 viral testing who are 72 years of age and older weighing at least 40 kg, and who are at high risk for progressing to severe COVID-19 and/or hospitalization.  2. The significant known and potential risks and benefits of COVID monoclonal antibody, and the extent to which such potential risks and benefits are unknown.  3. Information on available alternative treatments and the risks and benefits of those alternatives, including clinical trials.  4. Patients treated with COVID monoclonal antibody should continue to self-isolate and use infection control measures (e.g., wear mask, isolate, social distance, avoid sharing personal items, clean and disinfect high touch surfaces, and frequent handwashing) according to CDC guidelines.   5. The patient or parent/caregiver has the option to accept or refuse COVID monoclonal antibody treatment.  After reviewing this information with the patient, The patient agreed to proceed with receiving casirivimab\imdevimab  infusion and will be provided a copy of the Fact sheet prior to receiving the infusion. Kathrine Haddock 09/30/2019 12:51 PM  sx onset 8/25

## 2019-10-01 ENCOUNTER — Ambulatory Visit (HOSPITAL_COMMUNITY)
Admission: RE | Admit: 2019-10-01 | Discharge: 2019-10-01 | Disposition: A | Discharge status: Home / Self Care | Payer: Medicaid Other | Source: Ambulatory Visit | Attending: Pulmonary Disease | Admitting: Pulmonary Disease

## 2019-10-01 DIAGNOSIS — Z609 Problem related to social environment, unspecified: Secondary | ICD-10-CM | POA: Diagnosis not present

## 2019-10-01 DIAGNOSIS — U071 COVID-19: Secondary | ICD-10-CM | POA: Insufficient documentation

## 2019-10-01 MED ORDER — FAMOTIDINE IN NACL 20-0.9 MG/50ML-% IV SOLN: 20.0000 mg | Freq: Once | INTRAVENOUS | Status: DC | PRN

## 2019-10-01 MED ORDER — ALBUTEROL SULFATE HFA 108 (90 BASE) MCG/ACT IN AERS: 2.0000 | INHALATION_SPRAY | Freq: Once | RESPIRATORY_TRACT | Status: DC | PRN

## 2019-10-01 MED ORDER — SODIUM CHLORIDE 0.9 % IV SOLN: INTRAVENOUS | Status: DC | PRN

## 2019-10-01 MED ORDER — EPINEPHRINE 0.3 MG/0.3ML IJ SOAJ: 0.3000 mg | Freq: Once | INTRAMUSCULAR | Status: DC | PRN

## 2019-10-01 MED ORDER — DIPHENHYDRAMINE HCL 50 MG/ML IJ SOLN: 50.0000 mg | Freq: Once | INTRAMUSCULAR | Status: DC | PRN

## 2019-10-01 MED ORDER — SODIUM CHLORIDE 0.9 % IV SOLN
1200.0000 mg | Freq: Once | INTRAVENOUS | Status: AC
  Administered 2019-10-01: 1200 mg via INTRAVENOUS

## 2019-10-01 MED ORDER — METHYLPREDNISOLONE SODIUM SUCC 125 MG IJ SOLR: 125.0000 mg | Freq: Once | INTRAMUSCULAR | Status: DC | PRN

## 2019-10-01 NOTE — Discharge Instructions (Signed)

## 2019-10-26 ENCOUNTER — Other Ambulatory Visit: Payer: Self-pay | Admitting: Urology

## 2019-10-31 ENCOUNTER — Encounter (HOSPITAL_BASED_OUTPATIENT_CLINIC_OR_DEPARTMENT_OTHER): Payer: Self-pay | Admitting: Urology

## 2019-10-31 ENCOUNTER — Other Ambulatory Visit: Payer: Self-pay

## 2019-10-31 NOTE — Progress Notes (Signed)
Spoke w/ via phone for pre-op interview--- Lab needs dos----               Lab results------ COVID test 9/30 1030  Arrive at 1000 NPO after MN NO Solid Food.  Clear liquids from MN until  0900 Medications to take morning of surgery OXYBUTYNIN AND FLOMAX  Diabetic medication ----- Patient Special Instructions  HIBICLENS SHOWER  Pre-Op special Istructions ----- Patient verbalized understanding of instructions that were given at this phone interview. Patient denies shortness of breath, chest pain, fever, cough at this phone interview.

## 2019-11-01 ENCOUNTER — Other Ambulatory Visit (HOSPITAL_COMMUNITY)
Admission: RE | Admit: 2019-11-01 | Discharge: 2019-11-01 | Disposition: A | Discharge status: Home / Self Care | Payer: 59 | Source: Ambulatory Visit | Attending: Urology | Admitting: Urology

## 2019-11-01 DIAGNOSIS — Z20822 Contact with and (suspected) exposure to covid-19: Secondary | ICD-10-CM | POA: Insufficient documentation

## 2019-11-01 DIAGNOSIS — Z01812 Encounter for preprocedural laboratory examination: Secondary | ICD-10-CM | POA: Diagnosis not present

## 2019-11-01 LAB — SARS CORONAVIRUS 2 (TAT 6-24 HRS): SARS Coronavirus 2: NEGATIVE

## 2019-11-04 NOTE — Anesthesia Preprocedure Evaluation (Addendum)
Anesthesia Evaluation  Patient identified by MRN, date of birth, ID band Patient awake    Reviewed: Allergy & Precautions, NPO status , Patient's Chart, lab work & pertinent test results  Airway Mallampati: II  TM Distance: >3 FB Neck ROM: Full    Dental no notable dental hx. (+) Teeth Intact, Dental Advisory Given   Pulmonary neg pulmonary ROS,    Pulmonary exam normal breath sounds clear to auscultation       Cardiovascular Exercise Tolerance: Good Normal cardiovascular exam Rhythm:Regular Rate:Normal     Neuro/Psych negative neurological ROS  negative psych ROS   GI/Hepatic Neg liver ROS,   Endo/Other    Renal/GU    BPH    Musculoskeletal negative musculoskeletal ROS (+)   Abdominal   Peds  Hematology   Anesthesia Other Findings   Reproductive/Obstetrics                            Anesthesia Physical Anesthesia Plan  ASA: II  Anesthesia Plan: General   Post-op Pain Management:    Induction: Intravenous  PONV Risk Score and Plan: 3 and Treatment may vary due to age or medical condition, Ondansetron, Dexamethasone and Midazolam  Airway Management Planned: LMA  Additional Equipment: None  Intra-op Plan:   Post-operative Plan:   Informed Consent: I have reviewed the patients History and Physical, chart, labs and discussed the procedure including the risks, benefits and alternatives for the proposed anesthesia with the patient or authorized representative who has indicated his/her understanding and acceptance.     Dental advisory given  Plan Discussed with:   Anesthesia Plan Comments:        Anesthesia Quick Evaluation

## 2019-11-05 ENCOUNTER — Other Ambulatory Visit: Payer: Self-pay

## 2019-11-05 ENCOUNTER — Ambulatory Visit (HOSPITAL_BASED_OUTPATIENT_CLINIC_OR_DEPARTMENT_OTHER): Payer: 59 | Admitting: Anesthesiology

## 2019-11-05 ENCOUNTER — Ambulatory Visit (HOSPITAL_BASED_OUTPATIENT_CLINIC_OR_DEPARTMENT_OTHER)
Admission: RE | Admit: 2019-11-05 | Discharge: 2019-11-06 | Disposition: A | Discharge status: Home / Self Care | Payer: 59 | Attending: Urology | Admitting: Urology

## 2019-11-05 ENCOUNTER — Encounter (HOSPITAL_BASED_OUTPATIENT_CLINIC_OR_DEPARTMENT_OTHER): Payer: Self-pay | Admitting: Urology

## 2019-11-05 ENCOUNTER — Encounter (HOSPITAL_BASED_OUTPATIENT_CLINIC_OR_DEPARTMENT_OTHER)
Admission: RE | Disposition: A | Discharge status: Home / Self Care | Payer: Self-pay | Source: Home / Self Care | Attending: Urology

## 2019-11-05 DIAGNOSIS — R338 Other retention of urine: Secondary | ICD-10-CM | POA: Diagnosis not present

## 2019-11-05 DIAGNOSIS — N138 Other obstructive and reflux uropathy: Secondary | ICD-10-CM | POA: Diagnosis not present

## 2019-11-05 DIAGNOSIS — N401 Enlarged prostate with lower urinary tract symptoms: Secondary | ICD-10-CM | POA: Diagnosis not present

## 2019-11-05 DIAGNOSIS — N3000 Acute cystitis without hematuria: Secondary | ICD-10-CM | POA: Insufficient documentation

## 2019-11-05 DIAGNOSIS — N400 Enlarged prostate without lower urinary tract symptoms: Secondary | ICD-10-CM | POA: Diagnosis present

## 2019-11-05 DIAGNOSIS — Z842 Family history of other diseases of the genitourinary system: Secondary | ICD-10-CM | POA: Diagnosis not present

## 2019-11-05 HISTORY — PX: TRANSURETHRAL RESECTION OF PROSTATE: SHX73

## 2019-11-05 SURGERY — TURP (TRANSURETHRAL RESECTION OF PROSTATE)
Anesthesia: General | Site: Prostate

## 2019-11-05 MED ORDER — ACETAMINOPHEN 10 MG/ML IV SOLN: 1000.0000 mg | Freq: Once | INTRAVENOUS | Status: DC | PRN

## 2019-11-05 MED ORDER — LACTATED RINGERS IV SOLN
INTRAVENOUS | Status: DC
  Administered 2019-11-05: 1000 mL via INTRAVENOUS

## 2019-11-05 MED ORDER — ONDANSETRON HCL 4 MG/2ML IJ SOLN
INTRAMUSCULAR | Status: DC | PRN
  Administered 2019-11-05: 4 mg via INTRAVENOUS

## 2019-11-05 MED ORDER — CEPHALEXIN 500 MG PO CAPS: 500.0000 mg | ORAL_CAPSULE | Freq: Four times a day (QID) | ORAL | 0 refills | Status: AC

## 2019-11-05 MED ORDER — HYDROMORPHONE HCL 1 MG/ML IJ SOLN
INTRAMUSCULAR | Status: AC
Start: 2019-11-05 — End: ?
  Filled 2019-11-05: qty 1

## 2019-11-05 MED ORDER — DOCUSATE SODIUM 100 MG PO CAPS
ORAL_CAPSULE | ORAL | Status: AC
  Filled 2019-11-05: qty 1

## 2019-11-05 MED ORDER — SODIUM CHLORIDE 0.9 % IV SOLN: 250.0000 mL | INTRAVENOUS | Status: DC | PRN

## 2019-11-05 MED ORDER — LABETALOL HCL 5 MG/ML IV SOLN
INTRAVENOUS | Status: AC
  Filled 2019-11-05: qty 4

## 2019-11-05 MED ORDER — ACETAMINOPHEN 325 MG PO TABS: 650.0000 mg | ORAL_TABLET | ORAL | Status: DC | PRN

## 2019-11-05 MED ORDER — DEXAMETHASONE SODIUM PHOSPHATE 10 MG/ML IJ SOLN
INTRAMUSCULAR | Status: AC
  Filled 2019-11-05: qty 1

## 2019-11-05 MED ORDER — ONDANSETRON HCL 4 MG/2ML IJ SOLN: 4.0000 mg | INTRAMUSCULAR | Status: DC | PRN

## 2019-11-05 MED ORDER — FENTANYL CITRATE (PF) 100 MCG/2ML IJ SOLN
INTRAMUSCULAR | Status: AC
  Filled 2019-11-05: qty 2

## 2019-11-05 MED ORDER — HYDROCODONE-ACETAMINOPHEN 5-325 MG PO TABS
ORAL_TABLET | ORAL | Status: AC
  Filled 2019-11-05: qty 1

## 2019-11-05 MED ORDER — DOCUSATE SODIUM 100 MG PO CAPS: 100.0000 mg | ORAL_CAPSULE | Freq: Two times a day (BID) | ORAL | 0 refills | Status: AC

## 2019-11-05 MED ORDER — HYDROMORPHONE HCL 1 MG/ML IJ SOLN
0.2500 mg | INTRAMUSCULAR | Status: DC | PRN
  Administered 2019-11-05 (×2): 0.25 mg via INTRAVENOUS

## 2019-11-05 MED ORDER — LIDOCAINE 2% (20 MG/ML) 5 ML SYRINGE
INTRAMUSCULAR | Status: AC
  Filled 2019-11-05: qty 5

## 2019-11-05 MED ORDER — SODIUM CHLORIDE 0.9 % IR SOLN
Status: DC | PRN
  Administered 2019-11-05 (×3): 6000 mL

## 2019-11-05 MED ORDER — SODIUM CHLORIDE 0.9% FLUSH: 3.0000 mL | INTRAVENOUS | Status: DC | PRN

## 2019-11-05 MED ORDER — OXYBUTYNIN CHLORIDE 5 MG PO TABS
ORAL_TABLET | ORAL | Status: AC
  Filled 2019-11-05: qty 1

## 2019-11-05 MED ORDER — DEXAMETHASONE SODIUM PHOSPHATE 4 MG/ML IJ SOLN
INTRAMUSCULAR | Status: DC | PRN
  Administered 2019-11-05: 10 mg via INTRAVENOUS

## 2019-11-05 MED ORDER — SODIUM CHLORIDE 0.9 % IV SOLN: INTRAVENOUS | Status: DC

## 2019-11-05 MED ORDER — CEFAZOLIN SODIUM-DEXTROSE 2-4 GM/100ML-% IV SOLN
INTRAVENOUS | Status: AC
  Filled 2019-11-05: qty 100

## 2019-11-05 MED ORDER — OXYBUTYNIN CHLORIDE ER 10 MG PO TB24
10.0000 mg | ORAL_TABLET | Freq: Every day | ORAL | Status: DC
  Administered 2019-11-05: 10 mg via ORAL

## 2019-11-05 MED ORDER — HEPARIN SODIUM (PORCINE) 5000 UNIT/ML IJ SOLN
INTRAMUSCULAR | Status: AC
  Filled 2019-11-05: qty 1

## 2019-11-05 MED ORDER — BELLADONNA ALKALOIDS-OPIUM 16.2-60 MG RE SUPP: 1.0000 | Freq: Four times a day (QID) | RECTAL | Status: DC | PRN

## 2019-11-05 MED ORDER — OXYBUTYNIN CHLORIDE ER 10 MG PO TB24
ORAL_TABLET | ORAL | Status: AC
  Filled 2019-11-05: qty 1

## 2019-11-05 MED ORDER — MIDAZOLAM HCL 2 MG/2ML IJ SOLN
INTRAMUSCULAR | Status: AC
  Filled 2019-11-05: qty 2

## 2019-11-05 MED ORDER — ONDANSETRON HCL 4 MG/2ML IJ SOLN
INTRAMUSCULAR | Status: AC
  Filled 2019-11-05: qty 2

## 2019-11-05 MED ORDER — PROPOFOL 10 MG/ML IV BOLUS
INTRAVENOUS | Status: DC | PRN
  Administered 2019-11-05: 40 mg via INTRAVENOUS
  Administered 2019-11-05: 160 mg via INTRAVENOUS
  Administered 2019-11-05: 50 mg via INTRAVENOUS

## 2019-11-05 MED ORDER — CEFAZOLIN SODIUM-DEXTROSE 2-4 GM/100ML-% IV SOLN
2.0000 g | Freq: Once | INTRAVENOUS | Status: AC
  Administered 2019-11-05: 2 g via INTRAVENOUS

## 2019-11-05 MED ORDER — DIPHENHYDRAMINE HCL 12.5 MG/5ML PO ELIX: 12.5000 mg | ORAL_SOLUTION | Freq: Four times a day (QID) | ORAL | Status: DC | PRN

## 2019-11-05 MED ORDER — TAMSULOSIN HCL 0.4 MG PO CAPS: 0.4000 mg | ORAL_CAPSULE | Freq: Every day | ORAL | Status: DC

## 2019-11-05 MED ORDER — HEPARIN SODIUM (PORCINE) 5000 UNIT/ML IJ SOLN
5000.0000 [IU] | Freq: Three times a day (TID) | INTRAMUSCULAR | Status: DC
  Administered 2019-11-05 – 2019-11-06 (×3): 5000 [IU] via SUBCUTANEOUS

## 2019-11-05 MED ORDER — OXYCODONE-ACETAMINOPHEN 5-325 MG PO TABS: 1.0000 | ORAL_TABLET | ORAL | 0 refills | Status: DC | PRN

## 2019-11-05 MED ORDER — OXYBUTYNIN CHLORIDE 5 MG PO TABS
5.0000 mg | ORAL_TABLET | Freq: Three times a day (TID) | ORAL | Status: DC | PRN
  Administered 2019-11-05: 5 mg via ORAL

## 2019-11-05 MED ORDER — MEPERIDINE HCL 25 MG/ML IJ SOLN: 6.2500 mg | INTRAMUSCULAR | Status: DC | PRN

## 2019-11-05 MED ORDER — OXYCODONE HCL 5 MG PO TABS: 5.0000 mg | ORAL_TABLET | Freq: Once | ORAL | Status: DC | PRN

## 2019-11-05 MED ORDER — LIDOCAINE 2% (20 MG/ML) 5 ML SYRINGE
INTRAMUSCULAR | Status: DC | PRN
  Administered 2019-11-05: 100 mg via INTRAVENOUS

## 2019-11-05 MED ORDER — LABETALOL HCL 5 MG/ML IV SOLN
INTRAVENOUS | Status: DC | PRN
  Administered 2019-11-05 (×2): 5 mg via INTRAVENOUS

## 2019-11-05 MED ORDER — BACITRACIN-NEOMYCIN-POLYMYXIN 400-5-5000 EX OINT: 1.0000 "application " | TOPICAL_OINTMENT | Freq: Three times a day (TID) | CUTANEOUS | Status: DC | PRN

## 2019-11-05 MED ORDER — DOCUSATE SODIUM 100 MG PO CAPS
100.0000 mg | ORAL_CAPSULE | Freq: Two times a day (BID) | ORAL | Status: DC
  Administered 2019-11-05 (×2): 100 mg via ORAL

## 2019-11-05 MED ORDER — PROPOFOL 10 MG/ML IV BOLUS
INTRAVENOUS | Status: AC
  Filled 2019-11-05: qty 40

## 2019-11-05 MED ORDER — HYDROCODONE-ACETAMINOPHEN 5-325 MG PO TABS
1.0000 | ORAL_TABLET | ORAL | Status: DC | PRN
  Administered 2019-11-05 (×2): 1 via ORAL
  Administered 2019-11-06 (×3): 2 via ORAL

## 2019-11-05 MED ORDER — TAMSULOSIN HCL 0.4 MG PO CAPS
ORAL_CAPSULE | ORAL | Status: AC
  Filled 2019-11-05: qty 1

## 2019-11-05 MED ORDER — ONDANSETRON HCL 4 MG/2ML IJ SOLN: 4.0000 mg | Freq: Once | INTRAMUSCULAR | Status: DC | PRN

## 2019-11-05 MED ORDER — DIPHENHYDRAMINE HCL 50 MG/ML IJ SOLN: 12.5000 mg | Freq: Four times a day (QID) | INTRAMUSCULAR | Status: DC | PRN

## 2019-11-05 MED ORDER — AMISULPRIDE (ANTIEMETIC) 5 MG/2ML IV SOLN: 10.0000 mg | Freq: Once | INTRAVENOUS | Status: DC | PRN

## 2019-11-05 MED ORDER — FENTANYL CITRATE (PF) 100 MCG/2ML IJ SOLN
INTRAMUSCULAR | Status: DC | PRN
Start: 2019-11-05 — End: 2019-11-05
  Administered 2019-11-05 (×8): 25 ug via INTRAVENOUS

## 2019-11-05 MED ORDER — SODIUM CHLORIDE 0.9 % IR SOLN
3000.0000 mL | Status: DC
  Administered 2019-11-06: 3000 mL

## 2019-11-05 MED ORDER — MORPHINE SULFATE (PF) 4 MG/ML IV SOLN: 2.0000 mg | INTRAVENOUS | Status: DC | PRN

## 2019-11-05 MED ORDER — MIDAZOLAM HCL 5 MG/5ML IJ SOLN
INTRAMUSCULAR | Status: DC | PRN
  Administered 2019-11-05: 2 mg via INTRAVENOUS

## 2019-11-05 MED ORDER — SODIUM CHLORIDE 0.9% FLUSH
3.0000 mL | Freq: Two times a day (BID) | INTRAVENOUS | Status: DC
  Administered 2019-11-05: 3 mL via INTRAVENOUS

## 2019-11-05 MED ORDER — OXYCODONE HCL 5 MG/5ML PO SOLN: 5.0000 mg | Freq: Once | ORAL | Status: DC | PRN

## 2019-11-05 SURGICAL SUPPLY — 23 items
BAG DRAIN URO-CYSTO SKYTR STRL (DRAIN) ×3 IMPLANT
BAG DRN RND TRDRP ANRFLXCHMBR (UROLOGICAL SUPPLIES) ×1
BAG DRN UROCATH (DRAIN) ×1
BAG URINE DRAIN 2000ML AR STRL (UROLOGICAL SUPPLIES) ×3 IMPLANT
CATH FOLEY 3WAY 30CC 22FR (CATHETERS) ×2 IMPLANT
CLOTH BEACON ORANGE TIMEOUT ST (SAFETY) ×3 IMPLANT
GLOVE BIO SURGEON STRL SZ7 (GLOVE) ×2 IMPLANT
GLOVE BIOGEL PI IND STRL 7.0 (GLOVE) IMPLANT
GLOVE BIOGEL PI INDICATOR 7.0 (GLOVE) ×4
GOWN STRL REUS W/ TWL LRG LVL3 (GOWN DISPOSABLE) ×1 IMPLANT
GOWN STRL REUS W/TWL LRG LVL3 (GOWN DISPOSABLE) ×6
HOLDER FOLEY CATH W/STRAP (MISCELLANEOUS) ×3 IMPLANT
IV NS IRRIG 3000ML ARTHROMATIC (IV SOLUTION) ×14 IMPLANT
KIT TURNOVER CYSTO (KITS) ×3 IMPLANT
LOOP CUT BIPOLAR 24F LRG (ELECTROSURGICAL) ×3 IMPLANT
MANIFOLD NEPTUNE II (INSTRUMENTS) ×3 IMPLANT
PACK CYSTO (CUSTOM PROCEDURE TRAY) ×3 IMPLANT
SYR TOOMEY IRRIG 70ML (MISCELLANEOUS) ×3
SYRINGE TOOMEY IRRIG 70ML (MISCELLANEOUS) ×1 IMPLANT
TUBE CONNECTING 12'X1/4 (SUCTIONS) ×1
TUBE CONNECTING 12X1/4 (SUCTIONS) ×2 IMPLANT
TUBING UROLOGY SET (TUBING) ×3 IMPLANT
WATER STERILE IRR 500ML POUR (IV SOLUTION) ×2 IMPLANT

## 2019-11-05 NOTE — Op Note (Signed)
Operative Note  Preoperative diagnosis:  1.  BPH with bladder outlet obstruction  Postoperative diagnosis: 1.  BPH with bladder outlet obstruction  Procedure(s): 1.  Bipolar transurethral resection of prostate  Surgeon: Rexene Alberts, MD  Assistants:  None  Anesthesia:  General  Complications:  None  EBL:  74ml  Specimens: 1. Prostate chips ID Type Source Tests Collected by Time Destination  1 : prostate chips Tissue PATH Prostate TURP SURGICAL PATHOLOGY Janith Lima, MD 11/05/2019 1234    Drains/Catheters: 1.  22Fr 3 way catheter with 71ml water into balloon  Intraoperative findings:   1. Kissing lateral obstructed lobes with elevated bladder neck. Successful resection of prostate. Wide open channel at end of case. Excellent hemostasis.  Indication:  [name] is a 49 y.o. male with BPH with bladder outlet obstruction presenting for transurethral resection of the prostate. He had a volume of approximately 40 grams on CT. Uroflow demonstrated obstruction. Preop cystoscopy demonstrated obstructing bilateral lobes. He had several episodes of urinary retention. After thorough discussion including all relevant risk benefits and alternatives, he presents today for a bipolar TURP.  Description of procedure: The indication, alternatives, benefits and risks were discussed with the patient and informed status obtained.  Patient was brought to the operating room table, positioned supine, secured with a safety strap.  Pneumatic compression devices were placed on the lower extremities.  Over the ministration of intravenous antibiotics and general anesthesia, the patient was repositioned into the dorsal lithotomy position.  All pressure points were carefully padded.  A rectal examination was performed confirming a smooth symmetric enlarged gland.  The genitalia were prepped and draped in standard sterile manner.  A timeout was completed, verifying the correct patient, surgical procedure  and positioning prior to beginning the procedure.  Isotonic sodium chloride was used for irrigation.  A 26 French continuous-flow resectoscope sheath with the visual obturator and a 30 degree lens was advanced under direct vision into the bladder.  The anterior urethra appeared normal in its entirety. The prostatic urethra was elongated with bilobar hyperplasia with an elevated bladder neck.  On cystoscopic evaluation, his bladder capacity appeared normal, the bladder wall was noted to expand symmetrically in all dimensions.  There were no tumors, stones or foreign bodies or diverticula present. He had evidence of foley irritation on the posterior aspect. The bladder was not trabeculated with normal-appearing mucosa.  Both ureteral orifices were in their normal anatomic positions with clear urinary reflux noted bilaterally.  The after was removed and replaced by the working element with a resection loop.  The location of the ureteral orifices and the prostatic configuration were again confirmed.  Starting at the bladder neck and proceeding distally to the verumontanum a transurethral section of the prostate was performed using bipolar using energy of 4 and 5 for cutting and coagulation, respectively.  The procedure began at the bladder neck at the 5 o'clock and 7 o'clock positions and carefully carried distally to the verumontanum, resecting the intervening prostatic adenoma.  Next the left lateral lobe was resected to the level of the transverse capsular fibers.  The identical procedure was performed on the right lobe.  Attention was then directed anteriorly and the resection was completed from the 10 o'clock to 2 o'clock positions.  All bleeding vessels were fulgurated achieving meticulous hemostasis.  The bladder was irrigated with a Toomey syringe, ensuring removal of all prostate chips which were sent to pathology for evaluation.  Having completed the resection and the chips removed,  we can confirm  hemostasis with the loop with coagulating current.  Upon completion of the entire procedure, the bladder and posterior urethra were reexamined, confirming open prostatic urethra and bladder neck without evidence of bleeding or perforation.  Both ureteral orifices and the external sphincter were noted to be intact.  The resectoscope was withdrawn under direct vision and a 22 French three-way Foley catheter with a 30 cc balloon was inserted into the bladder.  The balloon was inflated with 30 cc of sterile water.  After multiple manual irrigations ensuring clear return of the irrigant, the procedure was terminated.  The catheter was attached to a drainage bag and continuous bladder irrigation was started with normal saline.  The patient was positioned supine.  At the end of the procedure, all counts were correct.  Patient tolerated the procedure well and was taken to the recovery room satisfactory condition.  Plan: Continuous bladder irrigation overnight.  Plan to discharge home tomorrow with Foley catheter in place and void trial in the office in 3 days.  Matt R. [facility]  Pager: [phone]

## 2019-11-05 NOTE — Transfer of Care (Addendum)
Immediate Anesthesia Transfer of Care Note  Patient: [name]  Procedure(s) Performed: Procedure(s) (LRB): TRANSURETHRAL RESECTION OF THE PROSTATE (TURP) (N/A)  Patient Location: PACU  Anesthesia Type: General  Level of Consciousness: awake, sedated, patient cooperative and responds to stimulation  Airway & Oxygen Therapy: Patient Spontanous Breathing and Patient connected to [place] 02 and soft FM   Post-op Assessment: Report given to PACU RN, Post -op Vital signs reviewed and stable and Patient moving all extremities  Post vital signs: Reviewed and stable  Complications: No apparent anesthesia complications

## 2019-11-05 NOTE — Anesthesia Procedure Notes (Signed)
Procedure Name: LMA Insertion Date/Time: 11/05/2019 12:21 PM Performed by: Justice Rocher, CRNA Pre-anesthesia Checklist: Patient identified, Emergency Drugs available, Suction available, Patient being monitored and Timeout performed Patient Re-evaluated:Patient Re-evaluated prior to induction Oxygen Delivery Method: Circle system utilized Preoxygenation: Pre-oxygenation with 100% oxygen Induction Type: IV induction Ventilation: Mask ventilation without difficulty LMA: LMA inserted LMA Size: 4.0 Number of attempts: 1 Airway Equipment and Method: Bite block Placement Confirmation: positive ETCO2,  breath sounds checked- equal and bilateral and CO2 detector Tube secured with: Tape Dental Injury: Teeth and Oropharynx as per pre-operative assessment

## 2019-11-05 NOTE — H&P (Signed)
Office Visit Report     10/18/2019   --------------------------------------------------------------------------------   [name]  MRN: [id number]  DOB: 1970-05-19, 49 year old Male  SSN:    PRIMARY CARE:  Chrisandra Netters, MD  REFERRING:  Arleta Creek, MD  PROVIDER:  Rexene Alberts, M.D.  LOCATION:  [facility] [phone]     --------------------------------------------------------------------------------   CC/HPI: [name] is a 49 year old male seen today in follow-up for urinary retention. He presents today for void trial.   He initially went to retention on 09/20/2019. Foley catheter placed at that time. Passed void trial on 09/27/2019 with PVR 50. In retention on 09/28/2019 with a bladder scan of 731m. Found to have positive urine culture and treated with antibiotics. Passed void trial 10/09/2019. In retention again 9/9 with PVR >4069m Ucx 100K pseudomonas on 10/2019. Treated with Cipro 500 mg twice daily for 7 days. He states catheter has been draining well. Denies fevers or chills. Denies gross hematuria. He denies problems with constipation, recent surgery, GU trauma, STDs, and spinal/neurologic disease. Denies any new medications.   He failed VT today with PVR of 17063mfter voiding around 140m17mysto demonstrated obstructing bladder neck. Foley replaced.   Patient currently denies fever, chills, sweats, nausea, vomiting, abdominal or flank pain, gross hematuria. Catheter has been draining well.   He denies any PMH. Not on any daily medications.   Family history: father with BPH, currently managed with foley catheter. No known history of GU cancers or other GU issues besides aforementioned.     ALLERGIES: None   MEDICATIONS: Cipro 500 mg tablet 1 tablet PO BID  Ditropan Xl 10 mg tablet, extended release 24 hr 1 tablet PO Daily Stop taking day prior to foley removal  Flomax 0.4 mg capsule 1 capsule PO Q PM     GU PSH: None   NON-GU PSH:  None   GU PMH: Acute Cystitis/UTI - 10/11/2019, - 09/28/2019 BPH w/LUTS - 10/11/2019, - 10/09/2019, - 09/28/2019 Urinary Retention - 10/11/2019, - 10/09/2019, - 09/27/2019    NON-GU PMH: None   FAMILY HISTORY: None   SOCIAL HISTORY: Marital Status: Married Preferred Language: English; Ethnicity: Not Hispanic Or Latino; Race: Black or African American Current Smoking Status: Patient has never smoked.   Tobacco Use Assessment Completed: Used Tobacco in last 30 days? Does not drink anymore.  Drinks 1 caffeinated drink per day.    REVIEW OF SYSTEMS:    GU Review Male:   Patient denies frequent urination, hard to postpone urination, burning/ pain with urination, get up at night to urinate, leakage of urine, stream starts and stops, trouble starting your stream, have to strain to urinate , erection problems, and penile pain.  Gastrointestinal (Upper):   Patient denies nausea, vomiting, and indigestion/ heartburn.  Gastrointestinal (Lower):   Patient denies diarrhea and constipation.  Constitutional:   Patient denies fever, night sweats, weight loss, and fatigue.  Skin:   Patient denies skin rash/ lesion and itching.  Eyes:   Patient denies blurred vision and double vision.  Ears/ Nose/ Throat:   Patient denies sore throat and sinus problems.  Hematologic/Lymphatic:   Patient denies swollen glands and easy bruising.  Cardiovascular:   Patient denies leg swelling and chest pains.  Respiratory:   Patient denies cough and shortness of breath.  Endocrine:   Patient denies excessive thirst.  Musculoskeletal:   Patient denies back pain and joint pain.  Neurological:   Patient denies headaches and dizziness.  Psychologic:  Patient denies depression and anxiety.   VITAL SIGNS:      10/18/2019 03:14 PM  Weight 150 lb / 68.04 kg  Height 66 in / 167.64 cm  BP 126/74 mmHg  Pulse 108 /min  Temperature 97.3 F / 36.2 C  BMI 24.2 kg/m   GU PHYSICAL EXAMINATION:    Anus and Perineum: No hemorrhoids. No  anal stenosis. No rectal fissure, no anal fissure. No edema, no dimple, no perineal tenderness, no anal tenderness.  Scrotum: No lesions. No edema. No cysts. No warts.  Epididymides: Right: no spermatocele, no masses, no cysts, no tenderness, no induration, no enlargement. Left: no spermatocele, no masses, no cysts, no tenderness, no induration, no enlargement.  Testes: No tenderness, no swelling, no enlargement left testes. No tenderness, no swelling, no enlargement right testes. Normal location left testes. Normal location right testes. No mass, no cyst, no varicocele, no hydrocele left testes. No mass, no cyst, no varicocele, no hydrocele right testes.  Urethral Meatus: Normal size. No lesion, no wart, no discharge, no polyp. Normal location.  Penis: Circumcised, no warts, no cracks. No dorsal Peyronie's plaques, no left corporal Peyronie's plaques, no right corporal Peyronie's plaques, no scarring, no warts. No balanitis, no meatal stenosis.   MULTI-SYSTEM PHYSICAL EXAMINATION:    Constitutional: Well-nourished. No physical deformities. Normally developed. Good grooming.  Cardiovascular: Normal temperature, normal extremity pulses, no swelling, no varicosities.  Gastrointestinal: No mass, no tenderness, no rigidity, non obese abdomen.     Complexity of Data:  Source Of History:  Patient, Medical Record Summary  Records Review:   AUA Symptom Score, Previous Doctor Records, Previous Hospital Records, Previous Patient Records  Urine Test Review:   Urinalysis, Urine Culture  Urodynamics Review:   Review Bladder Scan, Review Flow Rate   PROCEDURES:         Flexible Cystoscopy - 52000  Risks, benefits, and some of the potential complications of the procedure were discussed at length with the patient including infection, bleeding, voiding discomfort, urinary retention, fever, chills, sepsis, and others. All questions were answered. Informed consent was obtained. Antibiotic prophylaxis was given.  Sterile technique and intraurethral analgesia were used.  Meatus:  Normal size. Normal location. Normal condition.  Urethra:  No strictures.  External Sphincter:  Normal.  Verumontanum:  Normal.  Prostate:  Elevated bladder neck, obstructing, some bilobar obstruction, but not much adenoma, no significant intravesical component.  Bladder Neck:  Elevated, causing obstruction  Ureteral Orifices:  Normal location. Normal size. Normal shape. Effluxed clear urine.  Bladder:  No trabeculation. No tumors. Normal mucosa. No stones.      The lower urinary tract was carefully examined. The procedure was well-tolerated and without complications. Antibiotic instructions were given. Instructions were given to call the office immediately for bloody urine, difficulty urinating, urinary retention, painful or frequent urination, fever, chills, nausea, vomiting or other illness. The patient stated that he understood these instructions and would comply with them.        Flow Rate - 51741  Time of Peak Flow: 0:19 min:sec  Peak Flow Rate: 3.4 cc/sec  Total Void Time: 0:72 min:sec  Voided Volume: 118 cc  Flow Time: 0:64 min:sec  Average Flow Rate: 1.8 cc/sec           PVR Ultrasound - 85885  Scanned Volume: 170 cc        Simple Foley Catheterization - 51702  Pt was cleaned and prepped. A 16 French Foley catheter was inserted into the bladder using sterile technique.  A leg bag was connected. A urine culture was sent to the lab. A Urinalysis Auto W/Scope was sent to the lab.         Urinalysis w/Scope Dipstick Dipstick Cont'd Micro  Color: Straw Bilirubin: Neg mg/dL WBC/hpf: 0 - 5/hpf  Appearance: Slightly Cloudy Ketones: Neg mg/dL RBC/hpf: 10 - 20/hpf  Specific Gravity: 1.020 Blood: 3+ ery/uL Bacteria: Rare (0-9/hpf)  pH: <=5.0 Protein: Neg mg/dL Cystals: NS (Not Seen)  Glucose: Neg mg/dL Urobilinogen: 0.2 mg/dL Casts: NS (Not Seen)    Nitrites: Neg Trichomonas: Not Present    Leukocyte Esterase: Trace  leu/uL Mucous: Present      Epithelial Cells: 0 - 5/hpf      Yeast: NS (Not Seen)      Sperm: Not Present    ASSESSMENT:      ICD-10 Details  1 GU:   Urinary Retention - R33.8   2   BPH w/LUTS - N40.1   3   Acute Cystitis/UTI - N30.00    PLAN:            Medications Refill Meds: Cipro 500 mg tablet 1 tablet PO BID   #10  0 Refill(s)  Ditropan Xl 10 mg tablet, extended release 24 hr 1 tablet PO Daily Stop taking day prior to foley removal  #14  0 Refill(s)            Orders Labs Urinalysis w/Scope, CULTURE, URINE          Document Letter(s):  Created for Patient: Clinical Summary         Notes:   1. Acute urinary retention: 49 year old male who went into retention on 09/20/2019. Passed void trial on 09/27/2019 with PVR 50. In retention on 09/28/2019 with a bladder scan of 751m. Found to have positive urine culture 3 org >100K treated with antibiotics. Passed void trial 10/09/2019. In retention again 9/9 with PVR >4078m Ucx 9/9 100K pseudomonas rx with cipro.  -Failed VT 9/16  -Cysto 9/16 with elevated bladder beck causing obstruction  -Uroflow with evidence of obstruction. Peak flow rate 3.29m76mec, avg flow rate 1.8ml35mc  -Leave foley catheter in place and will plan for TURP   2. BPH with LUTS: CT scan on 09/20/2019 with a 37 g prostate on CT imaging. Episode of urinary retention as above, likely related to acute cystitis. Cysto 9/16 with elevated bladder neck causing obstruction. Uroflow 9/16 with evidence of obstruction. Will plan for TURP.  Risks and benefits of TURP of the prostate were reviewed in detail including infection, bleeding, blood transfusion, injury to bladder/urethra/surrounding structures, urinary incontinence, bladder neck contracture, persistent obstructive and irritative voiding symptoms, retrograde ejaculation and global anesthesia risks including but not limited to CVA, MI, DVT, PE, pneumonia, and death. He expressed understanding and desire to proceed.   3.  Acute cystitis: UA today with trace LE. Will send urine for cx. As pt had recent UTI, will cover a few days as he underwent cysto today.     Signed by MattRexene AlbertsD. on 10/18/19 at 4:20 PM (EDT)     The information contained in this medical record document is considered private and confidential patient information. This information can only be used for the medical diagnosis and/or medical services that are being provided by the patient's selected caregivers. This information can only be distributed outside of the patient's care if the patient agrees and signs waivers of authorization for this information to be sent to an outside source or route.  Urology Preoperative H&P  Chief Complaint: BPH  History of Present Illness: [name] is a 49 y.o. male with BPH here for TURP. No fevers or chills.   Past Medical History:  Diagnosis Date  . Seborrhea 01/04/2012  . Tinea pedis 01/04/2012    History reviewed. No pertinent surgical history.  Allergies: No Known Allergies  Family History  Problem Relation Age of Onset  . Hypertension Mother   . Cancer Father 85       Multiple myeloma  . Hypertension Father     Social History:  reports that he has never smoked. He has never used smokeless tobacco. He reports that he does not drink alcohol and does not use drugs.  ROS: A complete review of systems was performed.  All systems are negative except for pertinent findings as noted.  Physical Exam:  Vital signs in last 24 hours:   Constitutional:  Alert and oriented, No acute distress Cardiovascular: Regular rate and rhythm Respiratory: Normal respiratory effort, Lungs clear bilaterally GI: Abdomen is soft, nontender, nondistended, no abdominal masses GU: No CVA tenderness Lymphatic: No lymphadenopathy Neurologic: Grossly intact, no focal deficits Psychiatric: Normal mood and affect  Laboratory Data:  No results for input(s): WBC, HGB, HCT, PLT in the last 72 hours.  No  results for input(s): NA, K, CL, GLUCOSE, BUN, CALCIUM, CREATININE in the last 72 hours.  Invalid input(s): CO3   No results found for this or any previous visit (from the past 24 hour(s)). Recent Results (from the past 240 hour(s))  SARS CORONAVIRUS 2 (TAT 6-24 HRS) Nasopharyngeal Nasopharyngeal Swab     Status: None   Collection Time: 11/01/19  8:26 AM   Specimen: Nasopharyngeal Swab  Result Value Ref Range Status   SARS Coronavirus 2 NEGATIVE NEGATIVE Final    Comment: (NOTE) SARS-CoV-2 target nucleic acids are NOT DETECTED.  The SARS-CoV-2 RNA is generally detectable in upper and lower respiratory specimens during the acute phase of infection. Negative results do not preclude SARS-CoV-2 infection, do not rule out co-infections with other pathogens, and should not be used as the sole basis for treatment or other patient management decisions. Negative results must be combined with clinical observations, patient history, and epidemiological information. The expected result is Negative.  Fact Sheet for Patients: SugarRoll.be  Fact Sheet for Healthcare Providers: https://www.woods-mathews.com/  This test is not yet approved or cleared by the [place] FDA and  has been authorized for detection and/or diagnosis of SARS-CoV-2 by FDA under an Emergency Use Authorization (EUA). This EUA will remain  in effect (meaning this test can be used) for the duration of the COVID-19 declaration under Se ction 564(b)(1) of the Act, 21 U.S.C. section 360bbb-3(b)(1), unless the authorization is terminated or revoked sooner.  Performed at [facility], [place] [address] [place], [place] [address]     Renal Function: No results for input(s): CREATININE in the last 168 hours. CrCl cannot be calculated (Patient's most recent lab result is older than the maximum 21 days allowed.).  Radiologic Imaging: No results found.  I independently  reviewed the above imaging studies.  Assessment and Plan KELDEN LAVALLEE is a 49 y.o. male with BPH here for TURP. He consents for the procedure. Last urine cx negative.   Risk benefits of TURP were reviewed in detail including infection, bleeding, blood transfusion, injury to bladder/urethra/surrounding structures, erectile dysfunction, urinary incontinence, bladder neck contracture, persistent obstructive and irritative voiding symptoms, and global anesthesia risk including but not limited to CVA, MI, DVT,  PE, pneumonia and death.  He expressed understanding and desires to proceed.  Matt R. Bonnye Halle MD 11/05/2019, 9:48 AM  [facility] Pager: [phone]

## 2019-11-05 NOTE — Discharge Instructions (Signed)
Transurethral Resection of the Prostate, Care After This sheet gives you information about how to care for yourself after your procedure. Your health care provider may also give you more specific instructions. If you have problems or questions, contact your health care provider. What can I expect after the procedure? After the procedure, it is common to have: Mild pain in your lower abdomen. Soreness or mild discomfort in your penis from having the catheter inserted during the procedure. A feeling of urgency when you need to urinate. A small amount of blood in your urine. You may notice some small blood clots in your urine. These are normal. Follow these instructions at home: Medicines Take over-the-counter and prescription medicines only as told by your health care provider. If you were prescribed an antibiotic medicine, take it as told by your health care provider. Do not stop taking the antibiotic even if you start to feel better. Ask your health care provider if the medicine prescribed to you: Requires you to avoid driving or using heavy machinery. Can cause constipation. You may need to take actions to prevent or treat constipation, such as: Take over-the-counter or prescription medicines. Eat foods that are high in fiber, such as fresh fruits and vegetables, whole grains, and beans. Limit foods that are high in fat and processed sugars, such as fried or sweet foods. Do not drive for 24 hours if you were given a sedative during your procedure. Activity  Return to your normal activities as told by your health care provider. Ask your health care provider what activities are safe for you. Do not lift anything that is heavier than 10 lb (4.5 kg), or the limit that you are told, for 3 weeks after the procedure or until your health care provider says that it is safe. Avoid intense physical activity for as long as told by your health care provider. Avoid sitting for a long time without moving.  Get up and move around one or more times every few hours. This helps to prevent blood clots. You may increase your physical activity gradually as you start to feel better. Lifestyle Do not drink alcohol for as long as told by your health care provider. This is especially important if you are taking prescription pain medicines. Do not engage in sexual activity until your health care provider says that you can do this. General instructions  Do not take baths, swim, or use a hot tub until your health care provider approves. Drink enough fluid to keep your urine pale yellow. Urinate as soon as you feel the need to. Do not try to hold your urine for long periods of time. If your health care provider approves, you may take a stool softener for 2-3 weeks to prevent you from straining to have a bowel movement. Wear compression stockings as told by your health care provider. These stockings help to prevent blood clots and reduce swelling in your legs. Keep all follow-up visits as told by your health care provider. This is important. Contact a health care provider if you have: Difficulty urinating. A fever. Pain that gets worse or does not improve with medicine. Blood in your urine that does not go away after 1 week of resting and drinking more fluids. Swelling in your penis or testicles. Get help right away if: You are unable to urinate. You are having more blood clots in your urine instead of fewer. You have: Large blood clots. A lot of blood in your urine. Pain in your back or  lower abdomen. Pain or swelling in your legs. Chills and you are shaking. Difficulty breathing or shortness of breath. Summary After the procedure, it is common to have a small amount of blood in your urine. Avoid heavy lifting and intense physical activity for as long as told by your health care provider. Urinate as soon as you feel the need to. Do not try to hold your urine for long periods of time. Keep all  follow-up visits as told by your health care provider. This is important. This information is not intended to replace advice given to you by your health care provider. Make sure you discuss any questions you have with your health care provider. Document Revised: 05/10/2018 Document Reviewed: 10/19/2017 Elsevier Patient Education  [place], Adult An indwelling urinary catheter is a thin tube that is put into your bladder. The tube helps to drain pee (urine) out of your body. The tube goes in through your urethra. Your urethra is where pee comes out of your body. Your pee will come out through the catheter, then it will go into a bag (drainage bag). Take good care of your catheter so it will work well. How to wear your catheter and bag Supplies needed Sticky tape (adhesive tape) or a leg strap. Alcohol wipe or soap and water (if you use tape). A clean towel (if you use tape). Large overnight bag. Smaller bag (leg bag). Wearing your catheter Attach your catheter to your leg with tape or a leg strap. Make sure the catheter is not pulled tight. If a leg strap gets wet, take it off and put on a dry strap. If you use tape to hold the bag on your leg: Use an alcohol wipe or soap and water to wash your skin where the tape made it sticky before. Use a clean towel to pat-dry that skin. Use new tape to make the bag stay on your leg. Wearing your bags You should have been given a large overnight bag. You may wear the overnight bag in the day or night. Always have the overnight bag lower than your bladder.  Do not let the bag touch the floor. Before you go to sleep, put a clean plastic bag in a wastebasket. Then hang the overnight bag inside the wastebasket. You should also have a smaller leg bag that fits under your clothes. Always wear the leg bag below your knee. Do not wear your leg bag at night. How to care for your skin and catheter Supplies  needed A clean washcloth. Water and mild soap. A clean towel. Caring for your skin and catheter     Clean the skin around your catheter every day: Wash your hands with soap and water. Wet a clean washcloth in warm water and mild soap. Clean the skin around your urethra. If you are male: Gently spread the folds of skin around your vagina (labia). With the washcloth in your other hand, wipe the inner side of your labia on each side. Wipe from front to back. If you are male: Pull back any skin that covers the end of your penis (foreskin). With the washcloth in your other hand, wipe your penis in small circles. Start wiping at the tip of your penis, then move away from the catheter. Move the foreskin back in place, if needed. With your free hand, hold the catheter close to where it goes into your body. Keep holding the catheter during cleaning so it does not get  pulled out. With the washcloth in your other hand, clean the catheter. Only wipe downward on the catheter. Do not wipe upward toward your body. Doing this may push germs into your urethra and cause infection. Use a clean towel to pat-dry the catheter and the skin around it. Make sure to wipe off all soap. Wash your hands with soap and water. Shower every day. Do not take baths. Do not use cream, ointment, or lotion on the area where the catheter goes into your body, unless your doctor tells you to. Do not use powders, sprays, or lotions on your genital area. Check your skin around the catheter every day for signs of infection. Check for: Redness, swelling, or pain. Fluid or blood. Warmth. Pus or a bad smell. How to empty the bag Supplies needed Rubbing alcohol. Gauze pad or cotton ball. Tape or a leg strap. Emptying the bag Pour the pee out of your bag when it is ?- full, or at least 2-3 times a day. Do this for your overnight bag and your leg bag. Wash your hands with soap and water. Separate (detach) the bag from  your leg. Hold the bag over the toilet or a clean pail. Keep the bag lower than your hips and bladder. This is so the pee (urine) does not go back into the tube. Open the pour spout. It is at the bottom of the bag. Empty the pee into the toilet or pail. Do not let the pour spout touch any surface. Put rubbing alcohol on a gauze pad or cotton ball. Use the gauze pad or cotton ball to clean the pour spout. Close the pour spout. Attach the bag to your leg with tape or a leg strap. Wash your hands with soap and water. Follow instructions for cleaning the drainage bag: From the product maker. As told by your doctor. How to change the bag Supplies needed Alcohol wipes. A clean bag. Tape or a leg strap. Changing the bag Replace your bag when it starts to leak, smell bad, or look dirty. Wash your hands with soap and water. Separate the dirty bag from your leg. Pinch the catheter with your fingers so that pee does not spill out. Separate the catheter tube from the bag tube where these tubes connect (at the connection valve). Do not let the tubes touch any surface. Clean the end of the catheter tube with an alcohol wipe. Use a different alcohol wipe to clean the end of the bag tube. Connect the catheter tube to the tube of the clean bag. Attach the clean bag to your leg with tape or a leg strap. Do not make the bag tight on your leg. Wash your hands with soap and water. General rules  Never pull on your catheter. Never try to take it out. Doing that can hurt you. Always wash your hands before and after you touch your catheter or bag. Use a mild, fragrance-free soap. If you do not have soap and water, use hand sanitizer. Always make sure there are no twists or bends (kinks) in the catheter tube. Always make sure there are no leaks in the catheter or bag. Drink enough fluid to keep your pee pale yellow. Do not take baths, swim, or use a hot tub. If you are male, wipe from front to back after  you poop (have a bowel movement). Contact a doctor if: Your pee is cloudy. Your pee smells worse than usual. Your catheter gets clogged. Your catheter leaks. Your bladder  feels full. Get help right away if: You have redness, swelling, or pain where the catheter goes into your body. You have fluid, blood, pus, or a bad smell coming from the area where the catheter goes into your body. Your skin feels warm where the catheter goes into your body. You have a fever. You have pain in your: Belly (abdomen). Legs. Lower back. Bladder. You see blood in the catheter. Your pee is pink or red. You feel sick to your stomach (nauseous). You throw up (vomit). You have chills. Your pee is not draining into the bag. Your catheter gets pulled out. Summary An indwelling urinary catheter is a thin tube that is placed into the bladder to help drain pee (urine) out of the body. The catheter is placed into the part of the body that drains pee from the bladder (urethra). Taking good care of your catheter will keep it working properly and help prevent problems. Always wash your hands before and after touching your catheter or bag. Never pull on your catheter or try to take it out. This information is not intended to replace advice given to you by your health care provider. Make sure you discuss any questions you have with your health care provider. Document Revised: 05/12/2018 Document Reviewed: 09/03/2016 Elsevier Patient Education  [place].  Activity:  You are encouraged to ambulate frequently (about every hour during waking hours) to help prevent blood clots from forming in your legs or lungs.  However, you should not engage in any heavy lifting (> 10-15 lbs), strenuous activity, or straining.   Diet: You should advance your diet as instructed by your physician.  It will be normal to have some bloating, nausea, and abdominal discomfort intermittently.   Prescriptions:  You will be  provided a prescription for pain medication to take as needed.  If your pain is not severe enough to require the prescription pain medication, you may take extra strength Tylenol instead which will have less side effects.  You should also take a prescribed stool softener to avoid straining with bowel movements as the prescription pain medication may constipate you.   Incisions: You may remove your dressing bandages 48 hours after surgery if not removed in the hospital.  You will either have some small staples or special tissue glue at each of the incision sites. Once the bandages are removed (if present), the incisions may stay open to air.  You may start showering (but not soaking or bathing in water) the 2nd day after surgery and the incisions simply need to be patted dry after the shower.  No additional care is needed.   What to call us about: You should call the office [phone]) if you develop fever > 101 or develop persistent vomiting. Activity:  You are encouraged to ambulate frequently (about every hour during waking hours) to help prevent blood clots from forming in your legs or lungs.  However, you should not engage in any heavy lifting (> 10-15 lbs), strenuous activity, or straining.  -You have a foley catheter draining your bladder. F/u in the office on Thursday at [facility] for foley removal.

## 2019-11-05 NOTE — Anesthesia Postprocedure Evaluation (Signed)
Anesthesia Post Note  Patient: [name]  Procedure(s) Performed: TRANSURETHRAL RESECTION OF THE PROSTATE (TURP) (N/A Prostate)     Patient location during evaluation: PACU Anesthesia Type: General Level of consciousness: awake and sedated Pain management: pain level controlled Vital Signs Assessment: post-procedure vital signs reviewed and stable Respiratory status: spontaneous breathing Cardiovascular status: stable Postop Assessment: no apparent nausea or vomiting Anesthetic complications: no   No complications documented.  Last Vitals:  Vitals:   11/05/19 1330 11/05/19 1345  BP: 110/68 (!) 109/54  Pulse: 82 74  Resp: 12 10  Temp:    SpO2: 100%     Last Pain:  Vitals:   11/05/19 1400  TempSrc:   PainSc: [place] Jr

## 2019-11-05 NOTE — OR Nursing (Addendum)
Foley cath was removed at 1220H. 50 cc of urine output was noted.

## 2019-11-06 DIAGNOSIS — N3000 Acute cystitis without hematuria: Secondary | ICD-10-CM | POA: Diagnosis not present

## 2019-11-06 LAB — BASIC METABOLIC PANEL
Anion gap: 14 (ref 5–15)
BUN: 7 mg/dL (ref 6–20)
CO2: 23 mmol/L (ref 22–32)
Calcium: 9.9 mg/dL (ref 8.9–10.3)
Chloride: 102 mmol/L (ref 98–111)
Creatinine, Ser: 0.73 mg/dL (ref 0.61–1.24)
GFR calc Af Amer: >60 mL/min (ref >60)
GFR calc non Af Amer: >60 mL/min (ref >60)
Glucose, Bld: 105 mg/dL — ABNORMAL HIGH (ref 70–99)
Potassium: 4.1 mmol/L (ref 3.5–5.1)
Sodium: 139 mmol/L (ref 135–145)

## 2019-11-06 LAB — CBC
HCT: 38.7 % — ABNORMAL LOW (ref 39.0–52.0)
Hemoglobin: 12.7 g/dL — ABNORMAL LOW (ref 13.0–17.0)
MCH: 30.3 pg (ref 26.0–34.0)
MCHC: 32.8 g/dL (ref 30.0–36.0)
MCV: 92.4 fL (ref 80.0–100.0)
Platelets: 305 10*3/uL (ref 150–400)
RBC: 4.19 MIL/uL — ABNORMAL LOW (ref 4.22–5.81)
RDW: 12.3 % (ref 11.5–15.5)
WBC: 7.7 10*3/uL (ref 4.0–10.5)
nRBC: 0 % (ref 0.0–0.2)

## 2019-11-06 LAB — SURGICAL PATHOLOGY

## 2019-11-06 MED ORDER — HYDROCODONE-ACETAMINOPHEN 5-325 MG PO TABS
ORAL_TABLET | ORAL | Status: AC
  Filled 2019-11-06: qty 2

## 2019-11-06 MED ORDER — HYDROCODONE-ACETAMINOPHEN 5-325 MG PO TABS
ORAL_TABLET | ORAL | Status: AC
Start: 2019-11-06 — End: ?
  Filled 2019-11-06: qty 2

## 2019-11-06 MED ORDER — HEPARIN SODIUM (PORCINE) 5000 UNIT/ML IJ SOLN
INTRAMUSCULAR | Status: AC
  Filled 2019-11-06: qty 1

## 2019-11-06 NOTE — Discharge Summary (Signed)
Date of admission: 11/05/2019  Date of discharge: 11/06/2019  Admission diagnosis: BPH  Discharge diagnosis: BPH  History and Physical: For full details, please see admission history and physical. Briefly, [name] is a 49 y.o. year old patient with BPH.   Hospital Course: Underwent TURP and was placed in extended observation overnight. He did well with clear urine from the foley on minimal CBI. His labs were appropriate. He was appropriate for discharge home on POD1.  Laboratory values:  Recent Labs    11/06/19 0520  HGB 12.7*  HCT 38.7*   Recent Labs    11/06/19 0520  CREATININE 0.73    Disposition: Home  Discharge instruction: The patient was instructed to be ambulatory but told to refrain from heavy lifting, strenuous activity, or driving.  Discharge medications:  Allergies as of 11/06/2019   No Known Allergies     Medication List    STOP taking these medications   oxybutynin 10 MG 24 hr tablet Commonly known as: DITROPAN-XL   tamsulosin 0.4 MG Caps capsule Commonly known as: FLOMAX     TAKE these medications   acetaminophen 500 MG tablet Commonly known as: TYLENOL Take 500 mg by mouth every 6 (six) hours as needed.   AZO BLADDER CONTROL/GO-LESS PO Take 1 tablet by mouth 2 (two) times daily as needed (For bladder). Last dose on 10/26/2019   cephALEXin 500 MG capsule Commonly known as: KEFLEX Take 1 capsule (500 mg total) by mouth 4 (four) times daily for 3 days.   docusate sodium 100 MG capsule Commonly known as: Colace Take 1 capsule (100 mg total) by mouth 2 (two) times daily.   oxyCODONE-acetaminophen 5-325 MG tablet Commonly known as: Percocet Take 1 tablet by mouth every 4 (four) hours as needed for up to 10 doses for severe pain.       Followup:   Follow-up Information    [facility] In 3 days.   Why: 10/7 at [place] for foley removal Contact information: [place]. [facility]  Pager: [phone]

## 2019-11-06 NOTE — Progress Notes (Signed)
CBI d/c per Dr. Abner Greenspan.  Plug inserted.  Instructed pt on converting to bedside drng at HS and leg bag during the day.  Discharge instructions reviewed with pt w/ voiced understanding.  Foley supplies given to pt.  All questions answered.

## 2019-11-06 NOTE — Progress Notes (Signed)
1 Day Post-Op Subjective: Pain controlled. No nausea or emesis. Tolerating diet. Tolerating foley. Foley clear with minimal CBI.  Objective: Vital signs in last 24 hours: Temp:  [97.4 F (36.3 C)-98.7 F (37.1 C)] 97.6 F (36.4 C) (10/05 0527) Pulse Rate:  [72-101] 84 (10/05 0527) Resp:  [8-16] 16 (10/05 0527) BP: (109-147)/(52-89) 122/52 (10/05 0527) SpO2:  [96 %-100 %] 98 % (10/05 0527) Weight:  [68.5 kg] 68.5 kg (10/04 1042)  Intake/Output from previous day: 10/04 0701 - 10/05 0700 In: 4860 [P.O.:600; I.V.:810] Out: 2778 [EUMPN:3614; Blood:50] Intake/Output this shift: Total I/O In: 2160 [I.V.:510; Other:1650] Out: 4600 [Urine:4600]  Physical Exam:  General: Alert and oriented CV: RRR Lungs: Clear Abdomen: Soft, ND, NT Ext: NT, No erythema  Lab Results: Recent Labs    11/06/19 0520  HGB 12.7*  HCT 38.7*   BMET Recent Labs    11/06/19 0520  NA 139  K 4.1  CL 102  CO2 23  GLUCOSE 105*  BUN 7  CREATININE 0.73  CALCIUM 9.9     Studies/Results: No results found.  Assessment/Plan: 1. BPH s/p TURP 11/05/19:  a. Cap inflow port  b. Diet as tolerated c. Pain control prn d. OOB, amb, IS, SQH e. Discharge home f. F/u in office on Thursday for VT   LOS: 0 days   [name] 11/06/2019, 6:58 AM [facility]  Pager: [phone]

## 2019-11-07 ENCOUNTER — Encounter (HOSPITAL_BASED_OUTPATIENT_CLINIC_OR_DEPARTMENT_OTHER): Payer: Self-pay | Admitting: Urology

## 2019-12-18 ENCOUNTER — Encounter: Payer: Self-pay | Admitting: Family Medicine

## 2019-12-18 ENCOUNTER — Other Ambulatory Visit: Payer: Self-pay

## 2019-12-18 ENCOUNTER — Ambulatory Visit (INDEPENDENT_AMBULATORY_CARE_PROVIDER_SITE_OTHER): Payer: 59 | Admitting: Family Medicine

## 2019-12-18 VITALS — BP 128/74 | HR 66 | Ht 66.0 in | Wt 156.0 lb

## 2019-12-18 DIAGNOSIS — Z1211 Encounter for screening for malignant neoplasm of colon: Secondary | ICD-10-CM | POA: Diagnosis not present

## 2019-12-18 DIAGNOSIS — E7800 Pure hypercholesterolemia, unspecified: Secondary | ICD-10-CM

## 2019-12-18 DIAGNOSIS — Z1159 Encounter for screening for other viral diseases: Secondary | ICD-10-CM

## 2019-12-18 DIAGNOSIS — K625 Hemorrhage of anus and rectum: Secondary | ICD-10-CM

## 2019-12-18 NOTE — Patient Instructions (Addendum)
It was great to see you!  Our plans for today:  -Today we discussed your concern for rectal bleeding.  We did a rectal exam which did not show any signs of an anal fissure or external hemorrhoid.  Your symptoms and overall presentation is most consistent with internal hemorrhoids. -It is currently recommended that males in your age group have a colonoscopy.  I think this would be reasonable to do and will provide even more reassurance. -In the meantime I would like for you to continue taking your stool softener to ensure that you have a soft bowel movement each day.  If this is not effective for this you can consider adding MiraLAX, 17 g/day.  You can increase the MiraLAX if needed in order to have a soft bowel movement each day. -If you note any jet black stools, blood present mixed in the stool or in the toilet bowl, or other concerning symptoms I recommend that you make a follow-up appointment.  -We are also going to check your cholesterol today.  Per recommendations we are going to screen for hepatitis C today as well.   We are checking some labs today, I will call you if they are abnormal will send you a MyChart message or a letter if they are normal.  If you do not hear about your labs in the next 2 weeks please let us know.  Take care and seek immediate care sooner if you develop any concerns.   Dr. [facility]

## 2019-12-18 NOTE — Assessment & Plan Note (Signed)
Assessment:  49 y.o. male with rectal bleeding for about 1 month after a previous urologic procedure.  Patient states that that procedure he was significantly constipated and during the bouts of constipation noticed some blood on the tissue paper.  Patient denies any blood in the stool, denies any blood in the toilet bowl, denies any black stools.  He states that about every other bowel movement he notes some blood on the tissue paper.  This is sometimes accompanied by discomfort but he denies any itching or sharp pains with defecation.  He states he has been using stool softeners and since doing this his blood in stool has spaced out and has become less frequent.  Overall differential is suggestive for hemorrhoids as the cause of the symptoms.  Physical exam does not show any external hemorrhoids or anal fissures present. Previous hemoglobin post procedure was 12.7.  I do not feel the need to repeat this at this time with patient only noting a small amount of blood on the tissue paper. Plan:  -Discussed with patient that his symptoms are most consistent with internal hemorrhoids and that he should continue taking the stool softener to achieve a soft bowel movement daily.  Discussed with him that he can add MiraLAX to the stool softener if needed in order to achieve this. -Discussed with patient that he is currently due for a screening colonoscopy and that this will provide extra reassurance to him.  Patient endorses that he is interested in having this done so I will send in a referral for this. -Patient plans to follow-up if he notes any blood mixed in the stool, blood in the toilet bowl, or jet black stools.

## 2019-12-18 NOTE — Progress Notes (Signed)
SUBJECTIVE:   CHIEF COMPLAINT / HPI:   Blood in stool: Patient is a 49 year old male that presents after noting blood in his stool for about a month off and on.  Patient states he had surgery for his prostate on October 4th and noted some blood in stool since then. Had bad constipation just prior and right after the surgery, tried stool softeners and then noted some blood. Only noticed some minor blood on tissue paper, no blood in stool. Does have some sharp pain with defecation. Never had hematoids in his past that he knows up. Sees some blood on tissue paper every other bowel movement or so. Denies black stools.      PERTINENT  PMH / PSH: Transurethral resection of the prostate performed during hospitalization on 10/4.  OBJECTIVE:   BP 128/74   Pulse 66   Ht 5\' 6"  (1.676 m)   Wt 156 lb (70.8 kg)   SpO2 99%   BMI 25.18 kg/m    General: NAD, pleasant, able to participate in exam Cardiac: RRR, no murmurs. Respiratory: CTAB, normal effort, No wheezes, rales or rhonchi Abdomen: Bowel sounds present, nontender, nondistended Rectal: No external hemorrhoids, no anal fissures visualized.  Patient with no sharp pain on insertion of the digit.  No noted blood on gloved finger. Psych: Normal affect and mood Lower extremity: No lower extremity edema  ASSESSMENT/PLAN:   Rectal bleeding Assessment:  49 y.o. male with rectal bleeding for about 1 month after a previous urologic procedure.  Patient states that that procedure he was significantly constipated and during the bouts of constipation noticed some blood on the tissue paper.  Patient denies any blood in the stool, denies any blood in the toilet bowl, denies any black stools.  He states that about every other bowel movement he notes some blood on the tissue paper.  This is sometimes accompanied by discomfort but he denies any itching or sharp pains with defecation.  He states he has been using stool softeners and since doing this his  blood in stool has spaced out and has become less frequent.  Overall differential is suggestive for hemorrhoids as the cause of the symptoms.  Physical exam does not show any external hemorrhoids or anal fissures present. Previous hemoglobin post procedure was 12.7.  I do not feel the need to repeat this at this time with patient only noting a small amount of blood on the tissue paper. Plan:  -Discussed with patient that his symptoms are most consistent with internal hemorrhoids and that he should continue taking the stool softener to achieve a soft bowel movement daily.  Discussed with him that he can add MiraLAX to the stool softener if needed in order to achieve this. -Discussed with patient that he is currently due for a screening colonoscopy and that this will provide extra reassurance to him.  Patient endorses that he is interested in having this done so I will send in a referral for this. -Patient plans to follow-up if he notes any blood mixed in the stool, blood in the toilet bowl, or jet black stools.   Pure hypercholesterolemia Assessment:  49 y.o. male with previous cholesterol 1 year ago elevated with total cholesterol 229, LDL 162, HDL 55.  Patient due for repeat cholesterol check.  Not currently on any statin medications and has been using diet to control his cholesterol over the past year. Plan: -We will check cholesterol panel today    -We will also screen for hepatitis C.  Lurline Del, [place]    This note was prepared using Dragon voice recognition software and may include unintentional dictation errors due to the inherent limitations of voice recognition software.

## 2019-12-18 NOTE — Assessment & Plan Note (Signed)
Assessment:  49 y.o. male with previous cholesterol 1 year ago elevated with total cholesterol 229, LDL 162, HDL 55.  Patient due for repeat cholesterol check.  Not currently on any statin medications and has been using diet to control his cholesterol over the past year. Plan: -We will check cholesterol panel today

## 2019-12-19 LAB — LIPID PANEL
Chol/HDL Ratio: 4.1 ratio (ref 0.0–5.0)
Cholesterol, Total: 217 mg/dL — ABNORMAL HIGH (ref 100–199)
HDL: 53 mg/dL (ref >39)
LDL Chol Calc (NIH): 145 mg/dL — ABNORMAL HIGH (ref 0–99)
Triglycerides: 106 mg/dL (ref 0–149)
VLDL Cholesterol Cal: 19 mg/dL (ref 5–40)

## 2019-12-19 LAB — HEPATITIS C ANTIBODY: Hep C Virus Ab: <0.1 s/co ratio (ref 0.0–0.9)

## 2020-03-10 ENCOUNTER — Ambulatory Visit (AMBULATORY_SURGERY_CENTER): Payer: Self-pay | Admitting: *Deleted

## 2020-03-10 ENCOUNTER — Other Ambulatory Visit: Payer: Self-pay

## 2020-03-10 VITALS — Ht 66.0 in | Wt 162.0 lb

## 2020-03-10 DIAGNOSIS — Z1211 Encounter for screening for malignant neoplasm of colon: Secondary | ICD-10-CM

## 2020-03-10 MED ORDER — PLENVU 140 G PO SOLR: 1.0000 | Freq: Once | ORAL | 0 refills | Status: AC

## 2020-03-10 NOTE — Progress Notes (Signed)
Patient is here in-person for PV. Patient denies any allergies to eggs or soy. Patient denies any problems with anesthesia/sedation. Patient denies any oxygen use at home. Patient denies taking any diet/weight loss medications or blood thinners. Patient is not being treated for MRSA or C-diff. Patient is aware of our care-partner policy and LHTDS-28 safety protocol. EMMI education assigned to the patient for the procedure, sent to [facility].   COVID-19 vaccines completed on 05/29/2019 x2, per patient.  Patient seen blood in stool after surgery and he discussed this with his PCP. Pt takes stool softener, pt denies constipation at this time.  Prep Prescription coupon given to the patient. Patient requested lower volume prep-Plenvu given-pt aware of the price.

## 2020-03-25 ENCOUNTER — Encounter: Payer: 59 | Admitting: Gastroenterology

## 2020-06-25 ENCOUNTER — Encounter: Payer: Self-pay | Admitting: Gastroenterology

## 2020-09-12 ENCOUNTER — Ambulatory Visit (AMBULATORY_SURGERY_CENTER): Payer: 59 | Admitting: *Deleted

## 2020-09-12 ENCOUNTER — Other Ambulatory Visit: Payer: Self-pay

## 2020-09-12 VITALS — Ht 66.0 in | Wt 151.0 lb

## 2020-09-12 DIAGNOSIS — Z1211 Encounter for screening for malignant neoplasm of colon: Secondary | ICD-10-CM

## 2020-09-12 NOTE — Progress Notes (Signed)
No egg or soy allergy known to patient  No issues with past sedation with any surgeries or procedures Patient denies ever being told they had issues or difficulty with intubation  No FH of Malignant Hyperthermia No diet pills per patient No home 02 use per patient  No blood thinners per patient  Pt denies issues with constipation - takes stool softener daily and has soft regular daily BM's  No A fib or A flutter  EMMI video to pt or via [place] 19 guidelines implemented in PV today with Pt and RN  Pt is fully vaccinated  for Covid   Pt has Golytely prep at home from 03-2020 PV   Due to the COVID-19 pandemic we are asking patients to follow certain guidelines.  Pt aware of COVID protocols and [facility] guidelines   Pt verified name, DOB, address and insurance during PV today. Pt mailed instruction packet to included paper to complete and mail back to [facility] with addressed and stamped envelope, Emmi video, copy of consent form to read and not return, and instructions. Pt encouraged to call with questions or issues.  My Chart instructions to pt as well

## 2020-09-26 ENCOUNTER — Encounter: Payer: Self-pay | Admitting: Gastroenterology

## 2020-09-26 ENCOUNTER — Ambulatory Visit (AMBULATORY_SURGERY_CENTER): Payer: 59 | Admitting: Gastroenterology

## 2020-09-26 ENCOUNTER — Telehealth: Payer: Self-pay | Admitting: Gastroenterology

## 2020-09-26 ENCOUNTER — Other Ambulatory Visit: Payer: Self-pay

## 2020-09-26 VITALS — BP 123/90 | HR 85 | Temp 98.6°F | Resp 12 | Ht 66.0 in | Wt 151.0 lb

## 2020-09-26 DIAGNOSIS — D123 Benign neoplasm of transverse colon: Secondary | ICD-10-CM

## 2020-09-26 DIAGNOSIS — Z1211 Encounter for screening for malignant neoplasm of colon: Secondary | ICD-10-CM | POA: Diagnosis not present

## 2020-09-26 MED ORDER — SODIUM CHLORIDE 0.9 % IV SOLN: 500.0000 mL | Freq: Once | INTRAVENOUS | Status: DC

## 2020-09-26 NOTE — Telephone Encounter (Signed)
Inbound call from patient. Have procedure scheduled for 9:30am. States he was not about to finish about 1/10th of the prep solution because he began feeling sick. Have use the restroom a lot. Needs to know if he can still come to his procedure. Best contact number [phone]

## 2020-09-26 NOTE — Progress Notes (Signed)
Report to PACU, RN, vss, BBS= Clear.  

## 2020-09-26 NOTE — Progress Notes (Signed)
HPI: This is a man with routine colon cancer risk   ROS: complete GI ROS as described in HPI, all other review negative.  Constitutional:  No unintentional weight loss   Past Medical History:  Diagnosis Date   Hyperlipidemia    past hx of borderline but recently controlled   Seborrhea 01/04/2012   Tinea pedis 01/04/2012    Past Surgical History:  Procedure Laterality Date   TRANSURETHRAL RESECTION OF PROSTATE N/A 11/05/2019   Procedure: TRANSURETHRAL RESECTION OF THE PROSTATE (TURP);  Surgeon: Janith Lima, MD;  Location: [facility];  Service: Urology;  Laterality: N/A;   WISDOM TOOTH EXTRACTION      Current Outpatient Medications  Medication Sig Dispense Refill   docusate sodium (COLACE) 100 MG capsule Take 100 mg by mouth 2 (two) times daily.     Current Facility-Administered Medications  Medication Dose Route Frequency Provider Last Rate Last Admin   0.9 %  sodium chloride infusion  500 mL Intravenous Once Milus Banister, MD        Allergies as of 09/26/2020   (No Known Allergies)    Family History  Problem Relation Age of Onset   Hypertension Mother    Colon polyps Mother    Cancer Father 19       Multiple myeloma   Hypertension Father    Colon polyps Father    Colon cancer Neg Hx    Esophageal cancer Neg Hx    Rectal cancer Neg Hx    Stomach cancer Neg Hx     Social History   Socioeconomic History   Marital status: Married    Spouse name: Not on file   Number of children: 1   Years of education: 16   Highest education level: Not on file  Occupational History    Employer: [employer]  Tobacco Use   Smoking status: Never   Smokeless tobacco: Never  Vaping Use   Vaping Use: Never used  Substance and Sexual Activity   Alcohol use: No   Drug use: Never   Sexual activity: Yes    Partners: Female  Other Topics Concern   Not on file  Social History Narrative   BA Finance   Employed by [employer]   Married, two  children               Social Determinants of Health   Financial Resource Strain: Not on file  Food Insecurity: Not on file  Transportation Needs: Not on file  Physical Activity: Not on file  Stress: Not on file  Social Connections: Not on file  Intimate Partner Violence: Not on file     Physical Exam: BP 114/70   Pulse 93   Temp 98.6 F (37 C) (Temporal)   Ht 5' 6"  (1.676 m)   Wt 151 lb (68.5 kg)   SpO2 97%   BMI 24.37 kg/m  Constitutional: generally well-appearing Psychiatric: alert and oriented x3 Lungs: CTA bilaterally Heart: no MCR  Assessment and plan: 50 y.o. male with routine risk for colon cancer  Screening colonoscolkyp today  Care is appropriate for the ambulatory setting.  Owens Loffler, MD [facility] 09/26/2020, 9:16 AM

## 2020-09-26 NOTE — Patient Instructions (Signed)
YOU HAD AN ENDOSCOPIC PROCEDURE TODAY AT [facility]:   Refer to the procedure report that was given to you for any specific questions about what was found during the examination.  If the procedure report does not answer your questions, please call your gastroenterologist to clarify.  If you requested that your care partner not be given the details of your procedure findings, then the procedure report has been included in a sealed envelope for you to review at your convenience later.  **Handout given on Polyps**  YOU SHOULD EXPECT: Some feelings of bloating in the abdomen. Passage of more gas than usual.  Walking can help get rid of the air that was put into your GI tract during the procedure and reduce the bloating. If you had a lower endoscopy (such as a colonoscopy or flexible sigmoidoscopy) you may notice spotting of blood in your stool or on the toilet paper. If you underwent a bowel prep for your procedure, you may not have a normal bowel movement for a few days.  Please Note:  You might notice some irritation and congestion in your nose or some drainage.  This is from the oxygen used during your procedure.  There is no need for concern and it should clear up in a day or so.  SYMPTOMS TO REPORT IMMEDIATELY:  Following lower endoscopy (colonoscopy or flexible sigmoidoscopy):  Excessive amounts of blood in the stool  Significant tenderness or worsening of abdominal pains  Swelling of the abdomen that is new, acute  Fever of 100F or higher  For urgent or emergent issues, a gastroenterologist can be reached at any hour by calling [phone]. Do not use MyChart messaging for urgent concerns.    DIET:  We do recommend a small meal at first, but then you may proceed to your regular diet.  Drink plenty of fluids but you should avoid alcoholic beverages for 24 hours.  ACTIVITY:  You should plan to take it easy for the rest of today and you should NOT DRIVE or use heavy  machinery until tomorrow (because of the sedation medicines used during the test).    FOLLOW UP: Our staff will call the number listed on your records 48-72 hours following your procedure to check on you and address any questions or concerns that you may have regarding the information given to you following your procedure. If we do not reach you, we will leave a message.  We will attempt to reach you two times.  During this call, we will ask if you have developed any symptoms of COVID 19. If you develop any symptoms (ie: fever, flu-like symptoms, shortness of breath, cough etc.) before then, please call [phone].  If you test positive for Covid 19 in the 2 weeks post procedure, please call and report this information to [place].    If any biopsies were taken you will be contacted by phone or by letter within the next 1-3 weeks.  Please call us at [phone] if you have not heard about the biopsies in 3 weeks.    SIGNATURES/CONFIDENTIALITY: You and/or your care partner have signed paperwork which will be entered into your electronic medical record.  These signatures attest to the fact that that the information above on your After Visit Summary has been reviewed and is understood.  Full responsibility of the confidentiality of this discharge information lies with you and/or your care-partner.

## 2020-09-26 NOTE — Op Note (Signed)
[place] Patient Name: Easten Savarino Procedure Date: 09/26/2020 9:20 AM MRN: [id number] Endoscopist: Milus Banister , MD Age: 50 Referring MD:  Date of Birth: 1970-03-21 Gender: Male Account #: [id number] Procedure:                Colonoscopy Indications:              Screening for colorectal malignant neoplasm Medicines:                Monitored Anesthesia Care Procedure:                Pre-Anesthesia Assessment:                           - Prior to the procedure, a History and Physical                            was performed, and patient medications and                            allergies were reviewed. The patient's tolerance of                            previous anesthesia was also reviewed. The risks                            and benefits of the procedure and the sedation                            options and risks were discussed with the patient.                            All questions were answered, and informed consent                            was obtained. Prior Anticoagulants: The patient has                            taken no previous anticoagulant or antiplatelet                            agents. ASA Grade Assessment: II - A patient with                            mild systemic disease. After reviewing the risks                            and benefits, the patient was deemed in                            satisfactory condition to undergo the procedure.                           After obtaining informed consent, the colonoscope  was passed under direct vision. Throughout the                            procedure, the patient's blood pressure, pulse, and                            oxygen saturations were monitored continuously. The                            Olympus CF-HQ190L ([id number]) Colonoscope was                            introduced through the anus and advanced to the the                            cecum, identified  by appendiceal orifice and                            ileocecal valve. The colonoscopy was performed                            without difficulty. The patient tolerated the                            procedure well. The quality of the bowel                            preparation was good. The ileocecal valve,                            appendiceal orifice, and rectum were photographed. Scope In: 9:23:06 AM Scope Out: 9:37:56 AM Scope Withdrawal Time: 0 hours 10 minutes 58 seconds  Total Procedure Duration: 0 hours 14 minutes 50 seconds  Findings:                 A 4 mm polyp was found in the transverse colon. The                            polyp was sessile. The polyp was removed with a                            cold snare. Resection and retrieval were complete.                           The exam was otherwise without abnormality on                            direct and retroflexion views. Complications:            No immediate complications. Estimated blood loss:                            None. Estimated Blood Loss:     Estimated blood loss: none. Impression:               -  One 4 mm polyp in the transverse colon, removed                            with a cold snare. Resected and retrieved.                           - The examination was otherwise normal on direct                            and retroflexion views. Recommendation:           - Patient has a contact number available for                            emergencies. The signs and symptoms of potential                            delayed complications were discussed with the                            patient. Return to normal activities tomorrow.                            Written discharge instructions were provided to the                            patient.                           - Resume previous diet.                           - Continue present medications.                           - Await pathology results. Milus Banister, MD 09/26/2020 9:39:43 AM This report has been signed electronically.

## 2020-09-26 NOTE — Progress Notes (Deleted)
VS- Donna Thomas. °

## 2020-09-26 NOTE — Progress Notes (Signed)
Cell phone off per pt VS- [name]  Pt's states no medical or surgical changes since previsit or office visit.  Called to room to assist during endoscopic procedure.  Patient ID and intended procedure confirmed with present staff. Received instructions for my participation in the procedure from the performing physician.

## 2020-09-30 ENCOUNTER — Telehealth: Payer: Self-pay | Admitting: *Deleted

## 2020-09-30 NOTE — Telephone Encounter (Signed)
  Follow up Call-  Call back number 09/26/2020  Post procedure Call Back phone  # [phone]  Permission to leave phone message Yes  Some recent data might be hidden     Patient questions:  Do you have a fever, pain , or abdominal swelling? No. Pain Score  0 *  Have you tolerated food without any problems? Yes.    Have you been able to return to your normal activities? Yes.    Do you have any questions about your discharge instructions: Diet   No. Medications  No. Follow up visit  No.  Do you have questions or concerns about your Care? No.  Actions: * If pain score is 4 or above: No action needed, pain <4.Have you developed a fever since your procedure? no  2.   Have you had an respiratory symptoms (SOB or cough) since your procedure? no  3.   Have you tested positive for COVID 19 since your procedure no  4.   Have you had any family members/close contacts diagnosed with the COVID 19 since your procedure?  no   If yes to any of these questions please route to Joylene John, RN and Joella Prince, RN

## 2020-10-02 ENCOUNTER — Encounter: Payer: Self-pay | Admitting: Gastroenterology

## 2020-10-20 DIAGNOSIS — C61 Malignant neoplasm of prostate: Secondary | ICD-10-CM | POA: Insufficient documentation

## 2020-10-20 NOTE — Progress Notes (Signed)
Radiation Oncology         [phone] ________________________________  Initial Outpatient Consultation  Name: [name] MRN: [id number]  Date: 10/21/2020  DOB: 10/04/1970  CC:Leeanne Rio, MD  Janith Lima, MD   REFERRING PHYSICIAN: Janith Lima, MD  DIAGNOSIS: 50 y.o. gentleman with Stage T1c adenocarcinoma of the prostate with Gleason score of 3+4, and PSA of 5.33.    ICD-10-CM   1. Malignant neoplasm of prostate ([place])  [facility] referral to Social Work      HISTORY OF PRESENT ILLNESS: [name] is a 50 y.o. male with a diagnosis of prostate cancer. He was followed in urology by Dr. Abner Greenspan.  He developed BPH with urinary obstruction with several attempts to treat prostatitis ultimately requiring TURP in October 2021.  His obstructive symptoms resolved at that point.  He was noted to have an elevated PSA of 5.33.  The patient proceeded to transrectal ultrasound with 12 biopsies of the prostate on 10/01/20.  The prostate volume measured 34 cc.  Out of 12 core biopsies, 2 were positive.  The maximum Gleason score was 3+4, and this was seen in right lateral apex.    The patient reviewed the biopsy results with his urologist and he has kindly been referred today for discussion of potential radiation treatment options.   PREVIOUS RADIATION THERAPY: No  PAST MEDICAL HISTORY:  Past Medical History:  Diagnosis Date   Hyperlipidemia    past hx of borderline but recently controlled   Seborrhea 01/04/2012   Tinea pedis 01/04/2012      PAST SURGICAL HISTORY: Past Surgical History:  Procedure Laterality Date   TRANSURETHRAL RESECTION OF PROSTATE N/A 11/05/2019   Procedure: TRANSURETHRAL RESECTION OF THE PROSTATE (TURP);  Surgeon: Janith Lima, MD;  Location: [facility];  Service: Urology;  Laterality: N/A;   WISDOM TOOTH EXTRACTION      FAMILY HISTORY:  Family History  Problem Relation Age of Onset   Hypertension Mother    Colon  polyps Mother    Cancer Father 57       Multiple myeloma   Hypertension Father    Colon polyps Father    Colon cancer Neg Hx    Esophageal cancer Neg Hx    Rectal cancer Neg Hx    Stomach cancer Neg Hx     SOCIAL HISTORY:  Social History   Socioeconomic History   Marital status: Married    Spouse name: Not on file   Number of children: 1   Years of education: 16   Highest education level: Not on file  Occupational History    Employer: [employer]  Tobacco Use   Smoking status: Never   Smokeless tobacco: Never  Vaping Use   Vaping Use: Never used  Substance and Sexual Activity   Alcohol use: No   Drug use: Never   Sexual activity: Yes    Partners: Female  Other Topics Concern   Not on file  Social History Narrative   BA Finance   Employed by [employer]   Married, two children               Social Determinants of Health   Financial Resource Strain: Not on file  Food Insecurity: Not on file  Transportation Needs: Not on file  Physical Activity: Not on file  Stress: Not on file  Social Connections: Not on file  Intimate Partner Violence: Not At Risk   Fear of  Current or Ex-Partner: No   Emotionally Abused: No   Physically Abused: No   Sexually Abused: No    ALLERGIES: Patient has no known allergies.  MEDICATIONS:  Current Outpatient Medications  Medication Sig Dispense Refill   docusate sodium (COLACE) 100 MG capsule Take 100 mg by mouth 2 (two) times daily.     No current facility-administered medications for this encounter.      REVIEW OF SYSTEMS:  On review of systems, the patient reports that he is doing well overall. He denies any chest pain, shortness of breath, cough, fevers, chills, night sweats, unintended weight changes. He denies any bowel disturbances, and denies abdominal pain, nausea or vomiting. He denies any new musculoskeletal or joint aches or pains. His IPSS was Total Score: 1, indicating mild urinary symptoms. His SHIM:  25, indicating he does not have any erectile dysfunction. A complete review of systems is obtained and is otherwise negative.  PHYSICAL EXAM:  Wt Readings from Last 3 Encounters:  10/21/20 156 lb 6.4 oz (70.9 kg)  09/26/20 151 lb (68.5 kg)  09/12/20 151 lb (68.5 kg)   Temp Readings from Last 3 Encounters:  10/21/20 (!) 97.3 F (36.3 C)  09/26/20 98.6 F (37 C) (Temporal)  11/06/19 (!) 97 F (36.1 C)   BP Readings from Last 3 Encounters:  10/21/20 (!) 118/56  09/26/20 123/90  12/18/19 128/74   Pulse Readings from Last 3 Encounters:  10/21/20 66  09/26/20 85  12/18/19 66   Pain Assessment Pain Score: 0-No pain/10  In general this is a well appearing man in no acute distress. He's alert and oriented x4 and appropriate throughout the examination. Cardiopulmonary assessment is negative for acute distress, and he exhibits normal effort.     KPS = 100  100 - Normal; no complaints; no evidence of disease. 90   - Able to carry on normal activity; minor signs or symptoms of disease. 80   - Normal activity with effort; some signs or symptoms of disease. 17   - Cares for self; unable to carry on normal activity or to do active work. 60   - Requires occasional assistance, but is able to care for most of his personal needs. 50   - Requires considerable assistance and frequent medical care. 65   - Disabled; requires special care and assistance. 30   - Severely disabled; hospital admission is indicated although death not imminent. 39   - Very sick; hospital admission necessary; active supportive treatment necessary. 10   - Moribund; fatal processes progressing rapidly. 0     - Dead  Karnofsky DA, Abelmann [place], Craver LS and Burchenal [facility] [phone]) The use of the nitrogen mustards in the palliative treatment of carcinoma: with particular reference to bronchogenic carcinoma Cancer 1 634-56  LABORATORY DATA:  Lab Results  Component Value Date   WBC 7.7 11/06/2019   HGB 12.7 (L) 11/06/2019    HCT 38.7 (L) 11/06/2019   MCV 92.4 11/06/2019   PLT 305 11/06/2019   Lab Results  Component Value Date   NA 139 11/06/2019   K 4.1 11/06/2019   CL 102 11/06/2019   CO2 23 11/06/2019   Lab Results  Component Value Date   ALT 22 09/20/2019   AST 33 09/20/2019   ALKPHOS 46 09/20/2019   BILITOT 1.4 (H) 09/20/2019     RADIOGRAPHY: No results found.    IMPRESSION/PLAN: 1. 50 y.o. gentleman with Stage T1c adenocarcinoma of the prostate with Gleason score of 3+4, and  PSA of 5.33.  We discussed the patient's workup and outlined the nature of prostate cancer in this setting. The patient's T stage, Gleason's score, and PSA put him into the Favorable Intermediate Risk group. Accordingly, he is eligible for a variety of potential treatment options including 5.5 weeks of external radiation  or prostatectomy.  He is not an ideal candidate for prostate brachytherapy given his recent TURP defect and his history of outflow obstructive symptoms.  We discussed the available external radiation techniques, and focused on the details and logistics of delivery.  We discussed and outlined the risks, benefits, short and long-term effects associated with radiotherapy and compared and contrasted these with prostatectomy. We discussed the role of SpaceOAR gel in reducing the rectal toxicity associated with radiotherapy.  We also discussed the influence of patient age on treatment selection.  He understands that most men who are in their early 88s select prostatectomy for a variety of reasons including the availability of salvage radiotherapy in the future if necessary.  He appears to have a good understanding of his disease and our treatment recommendations which are of curative intent.  He was encouraged to ask questions that were answered to his stated satisfaction.  At the conclusion of our conversation, the patient is interested in moving forward with robotic assisted laparoscopic radical prostatectomy.  We  personally spent 45 minutes in this encounter including chart review, reviewing radiological studies, meeting face-to-face with the patient, entering orders and completing documentation.      Tyler Pita, MD  [facility]  Radiation Oncology Direct Dial: [phone]  Fax: [phone] [email or web address]  Skype  LinkedIn

## 2020-10-21 ENCOUNTER — Telehealth (HOSPITAL_COMMUNITY): Payer: Self-pay | Admitting: General Practice

## 2020-10-21 ENCOUNTER — Ambulatory Visit
Admission: RE | Admit: 2020-10-21 | Discharge: 2020-10-21 | Disposition: A | Discharge status: Home / Self Care | Payer: 59 | Source: Ambulatory Visit | Attending: Radiation Oncology | Admitting: Radiation Oncology

## 2020-10-21 ENCOUNTER — Encounter: Payer: Self-pay | Admitting: Radiation Oncology

## 2020-10-21 ENCOUNTER — Other Ambulatory Visit: Payer: Self-pay

## 2020-10-21 VITALS — BP 118/56 | HR 66 | Temp 97.3°F | Resp 20 | Wt 156.4 lb

## 2020-10-21 DIAGNOSIS — E785 Hyperlipidemia, unspecified: Secondary | ICD-10-CM | POA: Diagnosis not present

## 2020-10-21 DIAGNOSIS — Z806 Family history of leukemia: Secondary | ICD-10-CM | POA: Diagnosis not present

## 2020-10-21 DIAGNOSIS — C61 Malignant neoplasm of prostate: Secondary | ICD-10-CM | POA: Insufficient documentation

## 2020-10-21 NOTE — Progress Notes (Signed)
GU Location of Tumor / Histology: Prostate Ca  If Prostate Cancer, Gleason Score is (3 + 4) and PSA is (5.33 as of 8/22)  Biopsies  Dr.Gay      Past/Anticipated interventions by urology, if any:  Dr. Abner Greenspan   Weight changes: Stable weight no concerns.  IPSS:  1 SHIM:  25  Bowel/Bladder complaints, if any:  No complaints at this time.  Nausea/Vomiting, if any: No  Pain issues, if any:  0/10  SAFETY ISSUES: Prior radiation?  No Pacemaker/ICD? No Possible current pregnancy?  Male Is the patient on methotrexate? No  Current Complaints / other details:  No issues at this time.

## 2020-10-21 NOTE — Telephone Encounter (Signed)
[place] CSW Progress Notes  First call to patient per Distress Screen referral.  Unable to reach, left VM w my contact information and encouragement to call back.  Edwyna Shell, LCSW Clinical Social Worker Phone:  [phone]

## 2020-10-22 ENCOUNTER — Telehealth: Payer: Self-pay | Admitting: General Practice

## 2020-10-22 NOTE — Telephone Encounter (Signed)
[facility] Psychosocial Distress Screening Clinical Social Work  Clinical Social Work was referred by distress screening protocol.  The patient scored a 5 on the Psychosocial Distress Thermometer which indicates moderate distress. Clinical Social Worker contacted patient by phone to assess for distress and other psychosocial needs. Unable to reach x2, left VM w generic information about Patient and [facility] and encouragement to return my call if desired.  Closing referral.   ONCBCN DISTRESS SCREENING 10/21/2020  Screening Type Initial Screening  Distress experienced in past week (1-10) 6  Practical problem type Work/school  Family Problem type Other (comment)    Clinical Social Worker follow up needed: No.  If yes, follow up plan:  Beverely Pace, Sharon, LCSW Clinical Social Worker Phone:  [phone]

## 2020-11-13 ENCOUNTER — Other Ambulatory Visit: Payer: Self-pay | Admitting: Urology

## 2020-12-10 NOTE — Progress Notes (Addendum)
Anesthesia Review:  PCP: Chrisandra Netters  Cardiologist : none  Chest x-ray : EKG : Echo : Stress test: Cardiac Cath :  Activity level: can do a flgiht of stairs without difficulty  Sleep Study/ CPAP : none  Fasting Blood Sugar :      / Checks Blood Sugar -- times a day:   Blood Thinner/ Instructions /Last Dose: ASA / Instructions/ Last Dose :   Called pt on 12/15/20 and LVMM that pt is to arrove at [facility] on 12/29/20 to  have covid test done right prior to surgery.  Asked pt to call nurse back to confirm he had received message .

## 2020-12-10 NOTE — Progress Notes (Signed)
DUE TO COVID-19 ONLY ONE VISITOR IS ALLOWED TO COME WITH YOU AND STAY IN THE WAITING ROOM ONLY DURING PRE OP AND PROCEDURE DAY OF SURGERY. THE 1 VISITOR  MAY VISIT WITH YOU AFTER SURGERY IN YOUR PRIVATE ROOM DURING VISITING HOURS ONLY!  YOU NEED TO HAVE A COVID 19 TEST ON_  ______ @_______ , THIS TEST MUST BE DONE BEFORE SURGERY,  COVID TESTING SITE IS AT [place]. PLEASE REMAIN IN YOUR CAR THIS IS A DRIVER UP TEST. AFTER YOUR COVID TEST PLEASE WEAR A MASK OUT IN PUBLIC AND SOCIAL DISTANCE AND [place] YOUR HANDS FREQUENTLY. PLEASE ASK ALL YOUR CLOSE CONTACTS TO WEAR A MASK OUT IN PUBLIC AND SOCIAL DISTANCE AND [place] HANDS FREQUENTLY ALSO.               [name]  12/10/2020   Your procedure is scheduled on:                12/29/20   Report to [facility] Main  Entrance   Report to admitting at     0930am      Call this number if you have problems the morning of surgery [phone]    Remember: Do not eat food , candy gum or mints :After Midnight. You may have clear liquids from midnight until __  0845am    Clear liquid diet the day before surgery.   Miralax 17 g in 4 ounces of water at 12 noon day before surgery.   Fleets enema nite before surgery.  CLEAR LIQUID DIET   Foods Allowed                                                                       Coffee and tea, regular and decaf                              Plain Jell-O any favor except red or purple                                            Fruit ices (not with fruit pulp)                                      Iced Popsicles                                     Carbonated beverages, regular and diet                                    Cranberry, grape and apple juices Sports drinks like Gatorade Lightly seasoned clear broth or consume(fat free) Sugar   _____________________________________________________________________    BRUSH YOUR TEETH MORNING OF SURGERY AND RINSE YOUR MOUTH OUT,  NO CHEWING GUM CANDY OR MINTS.     Take these medicines the  morning of surgery with A SIP OF WATER:   none   DO NOT TAKE ANY DIABETIC MEDICATIONS DAY OF YOUR SURGERY                               You may not have any metal on your body including hair pins and              piercings  Do not wear jewelry, make-up, lotions, powders or perfumes, deodorant             Do not wear nail polish on your fingernails.  Do not shave  48 hours prior to surgery.              Men may shave face and neck.   Do not bring valuables to the hospital. [place].  Contacts, dentures or bridgework may not be worn into surgery.  Leave suitcase in the car. After surgery it may be brought to your room.     Patients discharged the day of surgery will not be allowed to drive home. IF YOU ARE HAVING SURGERY AND GOING HOME THE SAME DAY, YOU MUST HAVE AN ADULT TO DRIVE YOU HOME AND BE WITH YOU FOR 24 HOURS. YOU MAY GO HOME BY TAXI OR UBER OR ORTHERWISE, BUT AN ADULT MUST ACCOMPANY YOU HOME AND STAY WITH YOU FOR 24 HOURS.  Name and phone number of your driver:  Special Instructions: N/A              Please read over the following fact sheets you were given: _____________________________________________________________________  [facility] - Preparing for Surgery Before surgery, you can play an important role.  Because skin is not sterile, your skin needs to be as free of germs as possible.  You can reduce the number of germs on your skin by washing with CHG (chlorahexidine gluconate) soap before surgery.  CHG is an antiseptic cleaner which kills germs and bonds with the skin to continue killing germs even after washing. Please DO NOT use if you have an allergy to CHG or antibacterial soaps.  If your skin becomes reddened/irritated stop using the CHG and inform your nurse when you arrive at Short Stay. Do not shave (including legs and underarms) for at least 48 hours prior  to the first CHG shower.  You may shave your face/neck. Please follow these instructions carefully:  1.  Shower with CHG Soap the night before surgery and the  morning of Surgery.  2.  If you choose to wash your hair, wash your hair first as usual with your  normal  shampoo.  3.  After you shampoo, rinse your hair and body thoroughly to remove the  shampoo.                           4.  Use CHG as you would any other liquid soap.  You can apply chg directly  to the skin and wash                       Gently with a scrungie or clean washcloth.  5.  Apply the CHG Soap to your body ONLY FROM THE NECK DOWN.   Do not use on face/ open  Wound or open sores. Avoid contact with eyes, ears mouth and genitals (private parts).                       Wash face,  Genitals (private parts) with your normal soap.             6.  Wash thoroughly, paying special attention to the area where your surgery  will be performed.  7.  Thoroughly rinse your body with warm water from the neck down.  8.  DO NOT shower/wash with your normal soap after using and rinsing off  the CHG Soap.                9.  Pat yourself dry with a clean towel.            10.  Wear clean pajamas.            11.  Place clean sheets on your bed the night of your first shower and do not  sleep with pets. Day of Surgery : Do not apply any lotions/deodorants the morning of surgery.  Please wear clean clothes to the hospital/surgery center.  FAILURE TO FOLLOW THESE INSTRUCTIONS MAY RESULT IN THE CANCELLATION OF YOUR SURGERY PATIENT SIGNATURE_________________________________  NURSE SIGNATURE__________________________________  ________________________________________________________________________

## 2020-12-12 ENCOUNTER — Encounter (HOSPITAL_COMMUNITY): Payer: Self-pay

## 2020-12-12 ENCOUNTER — Encounter (HOSPITAL_COMMUNITY)
Admission: RE | Admit: 2020-12-12 | Discharge: 2020-12-12 | Disposition: A | Discharge status: Home / Self Care | Payer: 59 | Source: Ambulatory Visit | Attending: Urology | Admitting: Urology

## 2020-12-12 ENCOUNTER — Other Ambulatory Visit: Payer: Self-pay

## 2020-12-12 VITALS — BP 111/64 | HR 67 | Temp 98.5°F | Resp 16 | Ht 66.0 in | Wt 151.0 lb

## 2020-12-12 DIAGNOSIS — C61 Malignant neoplasm of prostate: Secondary | ICD-10-CM

## 2020-12-12 DIAGNOSIS — Z01812 Encounter for preprocedural laboratory examination: Secondary | ICD-10-CM | POA: Insufficient documentation

## 2020-12-12 LAB — BASIC METABOLIC PANEL
Anion gap: 4 — ABNORMAL LOW (ref 5–15)
BUN: 13 mg/dL (ref 6–20)
CO2: 27 mmol/L (ref 22–32)
Calcium: 9.2 mg/dL (ref 8.9–10.3)
Chloride: 106 mmol/L (ref 98–111)
Creatinine, Ser: 0.8 mg/dL (ref 0.61–1.24)
GFR, Estimated: >60 mL/min (ref >60)
Glucose, Bld: 87 mg/dL (ref 70–99)
Potassium: 4.3 mmol/L (ref 3.5–5.1)
Sodium: 137 mmol/L (ref 135–145)

## 2020-12-12 LAB — CBC
HCT: 44.4 % (ref 39.0–52.0)
Hemoglobin: 14.8 g/dL (ref 13.0–17.0)
MCH: 30.1 pg (ref 26.0–34.0)
MCHC: 33.3 g/dL (ref 30.0–36.0)
MCV: 90.2 fL (ref 80.0–100.0)
Platelets: 260 10*3/uL (ref 150–400)
RBC: 4.92 MIL/uL (ref 4.22–5.81)
RDW: 11.9 % (ref 11.5–15.5)
WBC: 4.4 10*3/uL (ref 4.0–10.5)
nRBC: 0 % (ref 0.0–0.2)

## 2020-12-28 NOTE — Anesthesia Preprocedure Evaluation (Addendum)
Anesthesia Evaluation  Patient identified by MRN, date of birth, ID band Patient awake    Reviewed: Allergy & Precautions, NPO status , Patient's Chart, lab work & pertinent test results  Airway Mallampati: II  TM Distance: >3 FB Neck ROM: Full    Dental no notable dental hx. (+) Teeth Intact, Dental Advisory Given   Pulmonary neg pulmonary ROS,    Pulmonary exam normal breath sounds clear to auscultation       Cardiovascular negative cardio ROS Normal cardiovascular exam Rhythm:Regular Rate:Normal     Neuro/Psych negative neurological ROS  negative psych ROS   GI/Hepatic negative GI ROS, Neg liver ROS,   Endo/Other  negative endocrine ROS  Renal/GU Lab Results      Component                Value               Date                      CREATININE               0.80                12/12/2020                BUN                      13                  12/12/2020                NA                       137                 12/12/2020                K                        4.3                 12/12/2020                CL                       106                 12/12/2020                CO2                      27                  12/12/2020              Prostate CA    Musculoskeletal negative musculoskeletal ROS (+)   Abdominal   Peds  Hematology Lab Results      Component                Value               Date                      WBC  4.4                 12/12/2020                HGB                      14.8                12/12/2020                HCT                      44.4                12/12/2020                MCV                      90.2                12/12/2020                PLT                      260                 12/12/2020              Anesthesia Other Findings NKA  Reproductive/Obstetrics                            Anesthesia  Physical Anesthesia Plan  ASA: 2  Anesthesia Plan: General   Post-op Pain Management: Lidocaine infusion and Ketamine IV   Induction: Intravenous  PONV Risk Score and Plan: 3 and Treatment may vary due to age or medical condition, Midazolam, Dexamethasone and Ondansetron  Airway Management Planned: Oral ETT  Additional Equipment: None  Intra-op Plan:   Post-operative Plan: Extubation in OR  Informed Consent: I have reviewed the patients History and Physical, chart, labs and discussed the procedure including the risks, benefits and alternatives for the proposed anesthesia with the patient or authorized representative who has indicated his/her understanding and acceptance.     Dental advisory given  Plan Discussed with:   Anesthesia Plan Comments: ( GA + Lidocaine infusion)       Anesthesia Quick Evaluation

## 2020-12-29 ENCOUNTER — Encounter (HOSPITAL_COMMUNITY)
Admission: RE | Disposition: A | Discharge status: Home / Self Care | Payer: Self-pay | Source: Home / Self Care | Attending: Urology

## 2020-12-29 ENCOUNTER — Ambulatory Visit (HOSPITAL_COMMUNITY): Payer: 59 | Admitting: Anesthesiology

## 2020-12-29 ENCOUNTER — Ambulatory Visit (HOSPITAL_COMMUNITY)
Admission: RE | Admit: 2020-12-29 | Discharge: 2020-12-30 | Disposition: A | Discharge status: Home / Self Care | Payer: 59 | Attending: Urology | Admitting: Urology

## 2020-12-29 ENCOUNTER — Encounter (HOSPITAL_COMMUNITY): Payer: Self-pay | Admitting: Urology

## 2020-12-29 DIAGNOSIS — E871 Hypo-osmolality and hyponatremia: Secondary | ICD-10-CM | POA: Insufficient documentation

## 2020-12-29 DIAGNOSIS — C61 Malignant neoplasm of prostate: Principal | ICD-10-CM | POA: Insufficient documentation

## 2020-12-29 DIAGNOSIS — Z01818 Encounter for other preprocedural examination: Secondary | ICD-10-CM

## 2020-12-29 DIAGNOSIS — D72829 Elevated white blood cell count, unspecified: Secondary | ICD-10-CM | POA: Diagnosis not present

## 2020-12-29 DIAGNOSIS — Z20822 Contact with and (suspected) exposure to covid-19: Secondary | ICD-10-CM | POA: Diagnosis not present

## 2020-12-29 HISTORY — PX: ROBOT ASSISTED LAPAROSCOPIC RADICAL PROSTATECTOMY: SHX5141

## 2020-12-29 HISTORY — PX: LYMPHADENECTOMY: SHX5960

## 2020-12-29 LAB — TYPE AND SCREEN
ABO/RH(D): O POS
Antibody Screen: NEGATIVE

## 2020-12-29 LAB — SARS CORONAVIRUS 2 BY RT PCR (HOSPITAL ORDER, PERFORMED IN ~~LOC~~ HOSPITAL LAB): SARS Coronavirus 2: NEGATIVE

## 2020-12-29 LAB — ABO/RH: ABO/RH(D): O POS

## 2020-12-29 SURGERY — PROSTATECTOMY, RADICAL, ROBOT-ASSISTED, LAPAROSCOPIC
Anesthesia: General

## 2020-12-29 MED ORDER — POLYETHYLENE GLYCOL 3350 17 G PO PACK: 17.0000 g | PACK | Freq: Every day | ORAL | Status: DC

## 2020-12-29 MED ORDER — BUPIVACAINE LIPOSOME 1.3 % IJ SUSP
INTRAMUSCULAR | Status: DC | PRN
  Administered 2020-12-29: 40 mL

## 2020-12-29 MED ORDER — SODIUM CHLORIDE (PF) 0.9 % IJ SOLN
INTRAMUSCULAR | Status: AC
  Filled 2020-12-29: qty 20

## 2020-12-29 MED ORDER — MIDAZOLAM HCL 5 MG/5ML IJ SOLN
INTRAMUSCULAR | Status: DC | PRN
  Administered 2020-12-29: 2 mg via INTRAVENOUS

## 2020-12-29 MED ORDER — PROPOFOL 10 MG/ML IV BOLUS
INTRAVENOUS | Status: DC | PRN
  Administered 2020-12-29: 160 mg via INTRAVENOUS
  Administered 2020-12-29: 30 mg via INTRAVENOUS

## 2020-12-29 MED ORDER — ROCURONIUM BROMIDE 10 MG/ML (PF) SYRINGE
PREFILLED_SYRINGE | INTRAVENOUS | Status: AC
  Filled 2020-12-29: qty 10

## 2020-12-29 MED ORDER — PROPOFOL 10 MG/ML IV BOLUS
INTRAVENOUS | Status: AC
  Filled 2020-12-29: qty 20

## 2020-12-29 MED ORDER — CEFAZOLIN SODIUM-DEXTROSE 2-4 GM/100ML-% IV SOLN
INTRAVENOUS | Status: AC
  Filled 2020-12-29: qty 100

## 2020-12-29 MED ORDER — ONDANSETRON HCL 4 MG/2ML IJ SOLN: 4.0000 mg | Freq: Once | INTRAMUSCULAR | Status: DC | PRN

## 2020-12-29 MED ORDER — HYDROMORPHONE HCL 1 MG/ML IJ SOLN: 0.2500 mg | INTRAMUSCULAR | Status: DC | PRN

## 2020-12-29 MED ORDER — LIDOCAINE 2% (20 MG/ML) 5 ML SYRINGE
INTRAMUSCULAR | Status: DC | PRN
Start: 2020-12-29 — End: 2020-12-29
  Administered 2020-12-29: 75 mg via INTRAVENOUS

## 2020-12-29 MED ORDER — ESMOLOL HCL 100 MG/10ML IV SOLN
INTRAVENOUS | Status: AC
  Filled 2020-12-29: qty 10

## 2020-12-29 MED ORDER — PHENYLEPHRINE HCL-NACL 20-0.9 MG/250ML-% IV SOLN
INTRAVENOUS | Status: DC | PRN
  Administered 2020-12-29: 25 ug/min via INTRAVENOUS

## 2020-12-29 MED ORDER — SUGAMMADEX SODIUM 200 MG/2ML IV SOLN
INTRAVENOUS | Status: DC | PRN
  Administered 2020-12-29: 200 mg via INTRAVENOUS

## 2020-12-29 MED ORDER — SUCCINYLCHOLINE CHLORIDE 200 MG/10ML IV SOSY
PREFILLED_SYRINGE | INTRAVENOUS | Status: AC
  Filled 2020-12-29: qty 10

## 2020-12-29 MED ORDER — MORPHINE SULFATE (PF) 2 MG/ML IV SOLN: 2.0000 mg | INTRAVENOUS | Status: DC | PRN

## 2020-12-29 MED ORDER — LIDOCAINE HCL (PF) 2 % IJ SOLN
INTRAMUSCULAR | Status: AC
  Filled 2020-12-29: qty 5

## 2020-12-29 MED ORDER — OXYCODONE HCL 5 MG/5ML PO SOLN: 5.0000 mg | Freq: Once | ORAL | Status: DC | PRN

## 2020-12-29 MED ORDER — SODIUM CHLORIDE 0.45 % IV SOLN
INTRAVENOUS | Status: DC
  Administered 2020-12-30: 1000 mL via INTRAVENOUS

## 2020-12-29 MED ORDER — PHENYLEPHRINE HCL (PRESSORS) 10 MG/ML IV SOLN
INTRAVENOUS | Status: AC
  Filled 2020-12-29: qty 1

## 2020-12-29 MED ORDER — SENNA 8.6 MG PO TABS
1.0000 | ORAL_TABLET | Freq: Two times a day (BID) | ORAL | Status: DC
  Administered 2020-12-29 – 2020-12-30 (×2): 8.6 mg via ORAL
  Filled 2020-12-29: qty 1

## 2020-12-29 MED ORDER — AMISULPRIDE (ANTIEMETIC) 5 MG/2ML IV SOLN: 10.0000 mg | Freq: Once | INTRAVENOUS | Status: DC | PRN

## 2020-12-29 MED ORDER — MIDAZOLAM HCL 2 MG/2ML IJ SOLN
INTRAMUSCULAR | Status: AC
  Filled 2020-12-29: qty 2

## 2020-12-29 MED ORDER — PHENYLEPHRINE 40 MCG/ML (10ML) SYRINGE FOR IV PUSH (FOR BLOOD PRESSURE SUPPORT)
PREFILLED_SYRINGE | INTRAVENOUS | Status: AC
  Filled 2020-12-29: qty 10

## 2020-12-29 MED ORDER — FENTANYL CITRATE (PF) 100 MCG/2ML IJ SOLN
INTRAMUSCULAR | Status: AC
  Filled 2020-12-29: qty 2

## 2020-12-29 MED ORDER — DEXAMETHASONE SODIUM PHOSPHATE 10 MG/ML IJ SOLN
INTRAMUSCULAR | Status: AC
  Filled 2020-12-29: qty 1

## 2020-12-29 MED ORDER — ACETAMINOPHEN 10 MG/ML IV SOLN: 1000.0000 mg | Freq: Once | INTRAVENOUS | Status: DC | PRN

## 2020-12-29 MED ORDER — ACETAMINOPHEN 10 MG/ML IV SOLN
1000.0000 mg | Freq: Four times a day (QID) | INTRAVENOUS | Status: DC
  Administered 2020-12-29 – 2020-12-30 (×3): 1000 mg via INTRAVENOUS
  Filled 2020-12-29 (×2): qty 100

## 2020-12-29 MED ORDER — LIDOCAINE 20MG/ML (2%) 15 ML SYRINGE OPTIME
INTRAMUSCULAR | Status: DC | PRN
  Administered 2020-12-29: 1.5 mg/kg/h via INTRAVENOUS

## 2020-12-29 MED ORDER — ONDANSETRON HCL 4 MG/2ML IJ SOLN
INTRAMUSCULAR | Status: AC
  Filled 2020-12-29: qty 2

## 2020-12-29 MED ORDER — CEFAZOLIN SODIUM-DEXTROSE 2-4 GM/100ML-% IV SOLN
2.0000 g | INTRAVENOUS | Status: AC
  Administered 2020-12-29 (×2): 2 g via INTRAVENOUS
  Filled 2020-12-29: qty 100

## 2020-12-29 MED ORDER — FLEET ENEMA 7-19 GM/118ML RE ENEM: 1.0000 | ENEMA | Freq: Once | RECTAL | Status: DC

## 2020-12-29 MED ORDER — FENTANYL CITRATE (PF) 250 MCG/5ML IJ SOLN
INTRAMUSCULAR | Status: AC
  Filled 2020-12-29: qty 5

## 2020-12-29 MED ORDER — LACTATED RINGERS IV SOLN: INTRAVENOUS | Status: DC | PRN

## 2020-12-29 MED ORDER — OXYCODONE HCL 5 MG PO TABS
5.0000 mg | ORAL_TABLET | ORAL | Status: DC | PRN
  Administered 2020-12-30 (×2): 5 mg via ORAL

## 2020-12-29 MED ORDER — DOCUSATE SODIUM 100 MG PO CAPS
100.0000 mg | ORAL_CAPSULE | Freq: Two times a day (BID) | ORAL | Status: DC
  Administered 2020-12-29 – 2020-12-30 (×2): 100 mg via ORAL
  Filled 2020-12-29: qty 1

## 2020-12-29 MED ORDER — FENTANYL CITRATE (PF) 100 MCG/2ML IJ SOLN
INTRAMUSCULAR | Status: DC | PRN
  Administered 2020-12-29: 50 ug via INTRAVENOUS
  Administered 2020-12-29 (×2): 100 ug via INTRAVENOUS
  Administered 2020-12-29 (×3): 50 ug via INTRAVENOUS
  Administered 2020-12-29: 100 ug via INTRAVENOUS

## 2020-12-29 MED ORDER — KETAMINE HCL 10 MG/ML IJ SOLN
INTRAMUSCULAR | Status: AC
  Filled 2020-12-29: qty 1

## 2020-12-29 MED ORDER — BUPIVACAINE LIPOSOME 1.3 % IJ SUSP
INTRAMUSCULAR | Status: AC
  Filled 2020-12-29: qty 20

## 2020-12-29 MED ORDER — EPHEDRINE 5 MG/ML INJ
INTRAVENOUS | Status: AC
  Filled 2020-12-29: qty 5

## 2020-12-29 MED ORDER — CEFAZOLIN SODIUM-DEXTROSE 1-4 GM/50ML-% IV SOLN
1.0000 g | Freq: Three times a day (TID) | INTRAVENOUS | Status: AC
  Administered 2020-12-30 (×2): 1 g via INTRAVENOUS
  Filled 2020-12-29 (×2): qty 50

## 2020-12-29 MED ORDER — STERILE WATER FOR IRRIGATION IR SOLN
Status: DC | PRN
  Administered 2020-12-29: 1000 mL

## 2020-12-29 MED ORDER — LIDOCAINE HCL (PF) 2 % IJ SOLN
INTRAMUSCULAR | Status: AC
  Filled 2020-12-29: qty 10

## 2020-12-29 MED ORDER — SODIUM CHLORIDE (PF) 0.9 % IJ SOLN
INTRAMUSCULAR | Status: DC | PRN
  Administered 2020-12-29: 20 mL

## 2020-12-29 MED ORDER — LACTATED RINGERS IV SOLN: INTRAVENOUS | Status: DC

## 2020-12-29 MED ORDER — ROCURONIUM BROMIDE 10 MG/ML (PF) SYRINGE
PREFILLED_SYRINGE | INTRAVENOUS | Status: DC | PRN
  Administered 2020-12-29 (×2): 20 mg via INTRAVENOUS
  Administered 2020-12-29: 10 mg via INTRAVENOUS
  Administered 2020-12-29: 80 mg via INTRAVENOUS
  Administered 2020-12-29: 10 mg via INTRAVENOUS

## 2020-12-29 MED ORDER — KETAMINE HCL 10 MG/ML IJ SOLN
INTRAMUSCULAR | Status: DC | PRN
  Administered 2020-12-29: 35 mg via INTRAVENOUS
  Administered 2020-12-29: 15 mg via INTRAVENOUS

## 2020-12-29 MED ORDER — DEXAMETHASONE SODIUM PHOSPHATE 10 MG/ML IJ SOLN
INTRAMUSCULAR | Status: DC | PRN
  Administered 2020-12-29: 7 mg via INTRAVENOUS

## 2020-12-29 MED ORDER — ONDANSETRON HCL 4 MG/2ML IJ SOLN
INTRAMUSCULAR | Status: DC | PRN
  Administered 2020-12-29: 4 mg via INTRAVENOUS

## 2020-12-29 MED ORDER — OXYCODONE HCL 5 MG PO TABS: 5.0000 mg | ORAL_TABLET | Freq: Once | ORAL | Status: DC | PRN

## 2020-12-29 MED ORDER — LACTATED RINGERS IR SOLN
Status: DC | PRN
  Administered 2020-12-29: 1000 mL

## 2020-12-29 MED ORDER — FENTANYL CITRATE (PF) 100 MCG/2ML IJ SOLN
INTRAMUSCULAR | Status: DC | PRN
  Administered 2020-12-29: 22:00:00 50 ug via INTRAVENOUS
  Administered 2020-12-29: 21:00:00 100 ug via INTRAVENOUS
  Administered 2020-12-29: 21:00:00 50 ug via INTRAVENOUS

## 2020-12-29 SURGICAL SUPPLY — 77 items
ADH SKN CLS APL DERMABOND .7 (GAUZE/BANDAGES/DRESSINGS) ×2
AGENT HMST KT MTR STRL THRMB (HEMOSTASIS) ×2
APL ESCP 34 STRL LF DISP (HEMOSTASIS) ×2
APL PRP STRL LF DISP 70% ISPRP (MISCELLANEOUS) ×2
APL SWBSTK 6 STRL LF DISP (MISCELLANEOUS) ×2
APPLICATOR COTTON TIP 6 STRL (MISCELLANEOUS) ×2 IMPLANT
APPLICATOR COTTON TIP 6IN STRL (MISCELLANEOUS) ×3
APPLICATOR SURGIFLO ENDO (HEMOSTASIS) ×1 IMPLANT
BAG COUNTER SPONGE SURGICOUNT (BAG) IMPLANT
BAG SPNG CNTER NS LX DISP (BAG)
CATH FOLEY 2WAY 5CC 16FR (CATHETERS) ×3
CATH FOLEY 2WAY SLVR  5CC 18FR (CATHETERS) ×3
CATH FOLEY 2WAY SLVR 5CC 18FR (CATHETERS) ×2 IMPLANT
CATH ROBINSON RED A/P 16FR (CATHETERS) ×3 IMPLANT
CATH SILICONE 5CC 18FR (INSTRUMENTS) ×3 IMPLANT
CATH URTH STD 16FR FL 2W DRN (CATHETERS) IMPLANT
CHLORAPREP W/TINT 26 (MISCELLANEOUS) ×3 IMPLANT
CLIP LIGATING HEMO O LOK GREEN (MISCELLANEOUS) ×6 IMPLANT
COVER SURGICAL LIGHT HANDLE (MISCELLANEOUS) ×3 IMPLANT
COVER TIP SHEARS 8 DVNC (MISCELLANEOUS) ×2 IMPLANT
COVER TIP SHEARS 8MM DA VINCI (MISCELLANEOUS) ×3
CUTTER ECHEON FLEX ENDO 45 340 (ENDOMECHANICALS) IMPLANT
DERMABOND ADVANCED (GAUZE/BANDAGES/DRESSINGS) ×1
DERMABOND ADVANCED .7 DNX12 (GAUZE/BANDAGES/DRESSINGS) ×2 IMPLANT
DRAIN CHANNEL RND F F (WOUND CARE) IMPLANT
DRAPE ARM DVNC X/XI (DISPOSABLE) ×8 IMPLANT
DRAPE COLUMN DVNC XI (DISPOSABLE) ×2 IMPLANT
DRAPE DA VINCI XI ARM (DISPOSABLE) ×12
DRAPE DA VINCI XI COLUMN (DISPOSABLE) ×3
DRAPE SURG IRRIG POUCH 19X23 (DRAPES) ×3 IMPLANT
DRSG TEGADERM 4X4.75 (GAUZE/BANDAGES/DRESSINGS) IMPLANT
ELECT PENCIL ROCKER SW 15FT (MISCELLANEOUS) ×3 IMPLANT
ELECT REM PT RETURN 15FT ADLT (MISCELLANEOUS) ×3 IMPLANT
GAUZE 4X4 16PLY ~~LOC~~+RFID DBL (SPONGE) IMPLANT
GLOVE SURG ENC MOIS LTX SZ6.5 (GLOVE) ×3 IMPLANT
GLOVE SURG ENC TEXT LTX SZ7 (GLOVE) ×6 IMPLANT
GLOVE SURG UNDER POLY LF SZ7.5 (GLOVE) ×6 IMPLANT
GOWN STRL REUS W/TWL LRG LVL3 (GOWN DISPOSABLE) ×9 IMPLANT
HEMOSTAT POWDER SURGIFOAM 1G (HEMOSTASIS) IMPLANT
HEMOSTAT SURGICEL 4X8 (HEMOSTASIS) ×2 IMPLANT
HOLDER FOLEY CATH W/STRAP (MISCELLANEOUS) ×3 IMPLANT
IRRIG SUCT STRYKERFLOW 2 WTIP (MISCELLANEOUS) ×3
IRRIGATION SUCT STRKRFLW 2 WTP (MISCELLANEOUS) ×2 IMPLANT
IV LACTATED RINGERS 1000ML (IV SOLUTION) ×3 IMPLANT
KIT TURNOVER KIT A (KITS) IMPLANT
MARKER SKIN DUAL TIP RULER LAB (MISCELLANEOUS) ×3 IMPLANT
NDL INSUFFLATION 14GA 120MM (NEEDLE) ×2 IMPLANT
NEEDLE INSUFFLATION 14GA 120MM (NEEDLE) ×3 IMPLANT
PACK ROBOT UROLOGY CUSTOM (CUSTOM PROCEDURE TRAY) ×3 IMPLANT
PROTECTOR NERVE ULNAR (MISCELLANEOUS) ×3 IMPLANT
RELOAD STAPLE 45 4.1 GRN THCK (STAPLE) IMPLANT
SEAL CANN UNIV 5-8 DVNC XI (MISCELLANEOUS) ×8 IMPLANT
SEAL XI 5MM-8MM UNIVERSAL (MISCELLANEOUS) ×12
SET TUBE SMOKE EVAC HIGH FLOW (TUBING) ×3 IMPLANT
SOLUTION ELECTROLUBE (MISCELLANEOUS) ×3 IMPLANT
STAPLE RELOAD 45 GRN (STAPLE) IMPLANT
STAPLE RELOAD 45MM GREEN (STAPLE)
SURGIFLO W/THROMBIN 8M KIT (HEMOSTASIS) ×1 IMPLANT
SUT ETHILON 2 0 PS N (SUTURE) IMPLANT
SUT MNCRL 3 0 VIOLET RB1 (SUTURE) IMPLANT
SUT MNCRL AB 4-0 PS2 18 (SUTURE) ×6 IMPLANT
SUT MONOCRYL 3 0 RB1 (SUTURE)
SUT PDS AB 0 CT1 36 (SUTURE) ×6 IMPLANT
SUT VIC AB 0 CT1 27 (SUTURE) ×6
SUT VIC AB 0 CT1 27XBRD ANTBC (SUTURE) ×4 IMPLANT
SUT VIC AB 2-0 SH 27 (SUTURE) ×6
SUT VIC AB 2-0 SH 27XBRD (SUTURE) ×2 IMPLANT
SUT VIC AB 3-0 SH 27 (SUTURE)
SUT VIC AB 3-0 SH 27X BRD (SUTURE) IMPLANT
SUT VIC AB 4-0 RB1 27 (SUTURE) ×3
SUT VIC AB 4-0 RB1 27XBRD (SUTURE) IMPLANT
SUT VLOC 3-0 9IN GRN (SUTURE) IMPLANT
SUT VLOC BARB 180 ABS3/0GR12 (SUTURE) ×12
SUTURE VLOC BRB 180 ABS3/0GR12 (SUTURE) ×4 IMPLANT
TOWEL OR NON WOVEN STRL DISP B (DISPOSABLE) ×3 IMPLANT
TROCAR BLADELESS OPT 5 100 (ENDOMECHANICALS) ×1 IMPLANT
WATER STERILE IRR 1000ML POUR (IV SOLUTION) ×3 IMPLANT

## 2020-12-29 NOTE — Anesthesia Postprocedure Evaluation (Signed)
Anesthesia Post Note  Patient: [name]  Procedure(s) Performed: XI ROBOTIC ASSISTED LAPAROSCOPIC RADICAL PROSTATECTOMY LYMPHADENECTOMY/ PELVIC (Bilateral)     Patient location during evaluation: PACU Anesthesia Type: General Level of consciousness: awake and alert Pain management: pain level controlled Vital Signs Assessment: post-procedure vital signs reviewed and stable Respiratory status: spontaneous breathing, nonlabored ventilation, respiratory function stable and patient connected to nasal cannula oxygen Cardiovascular status: blood pressure returned to baseline and stable Postop Assessment: no apparent nausea or vomiting Anesthetic complications: no   No notable events documented.  Last Vitals:  Vitals:   12/29/20 2145 12/29/20 2200  BP: (!) 142/87 135/79  Pulse: 80 78  Resp: 13 12  Temp:  (!) 36.4 C  SpO2: 100% 100%    Last Pain:  Vitals:   12/29/20 2232  TempSrc:   PainSc: 2                  Barnet Glasgow

## 2020-12-29 NOTE — H&P (Signed)
Office Visit Report     12/09/2020   --------------------------------------------------------------------------------   [name]  MRN: [id number]  DOB: Sep 10, 1970, 50 year old Male  SSN:    PRIMARY CARE:  Lawrence Netters, MD  REFERRING:  Lawrence Lima, MD  PROVIDER:  Rexene Black, M.D.  TREATING:  [name], [place]  LOCATION:  [facility] [phone]     --------------------------------------------------------------------------------   CC/HPI: Pt presents today for pre-operative history and physical exam in anticipation of robotic assisted lap radical prostatectomy with bilateral pelvic lymph node dissection by Dr. Abner Black on 12/29/20. He is doing well and is without complaint.   Pt denies F/C, HA, CP, SOB, N/V, diarrhea/constipation, back pain, flank pain, hematuria, and dysuria.    HX:    [name] is seen in follow-up with his recent diagnosis of prostate cancer in 2 confirmed treatment selection.   He is seen with his wife, [name].   Patient underwent prostate biopsy on 10/01/2020 for an elevated PSA of 5.33 ng/mL. Biopsy revealed GS 3+4 = 7 in 1/12 core and GS 3+3 = 6 in 1/12 for, adenocarcinoma of the prostate with 2/12 cores positive (10-20%), TRUS volume of 34 cm3. Denies new or worsening bone or back pain. Good appetite and stable weight.   Family history: Denies  Imaging studies: None   PMH: None. No cardiac or pulmonary history. Denies taking anticoagulation.  PSH: TURP on 11/05/2019. No prior abdominal surgeries.   TNM stage: [id number]  PSA: 5.33  Gleason score: GS 3+4 = 7 and GS 3+3 = 6  Biopsy: 10/01/2020  Left: negative  Right: 3+3 = 6 at the right apex and 3+4 extent 7 in the right lateral apex  Prostate volume: 34cm^3  PSAD: 0.15   Nomogram  CSS (15-year): 99%  PFS (5 year, 10 year): 87%, 78%  EPE: 19%  LNI: 2%  SVI: 2%   IPSS: He is s/p TURP in 11/2019. IPSS score is 1.  SHIM: He did not complete this form. He  denies difficulty with erections.   He is like to proceed with robotic assisted laparoscopic prostatectomy with bilateral pelvic lymph node dissection.     ALLERGIES: None   MEDICATIONS: None   GU PSH: Complex Uroflow - 10/18/2019 Cystoscopy - 10/18/2019 Cystoscopy TURP - 11/05/2019 Prostate Needle Biopsy - 10/01/2020     NON-GU PSH: Surgical Pathology, Gross And Microscopic Examination For Prostate Needle - 10/01/2020     GU PMH: Prostate Cancer - 11/06/2020, - 10/08/2020 Elevated PSA - 10/01/2020 BPH w/LUTS - 09/03/2020, - 11/08/2019, - 10/18/2019, - 10/11/2019, - 10/09/2019, - 09/28/2019 Encounter for Prostate Cancer screening - 09/03/2020 Urinary Frequency - 09/03/2020 Urinary Retention - 11/08/2019, - 10/18/2019, - 10/11/2019, - 10/09/2019, - 09/27/2019 Acute Cystitis/UTI - 10/18/2019, - 10/11/2019, - 09/28/2019    NON-GU PMH: None   FAMILY HISTORY: None   SOCIAL HISTORY: Marital Status: Married Preferred Language: English; Ethnicity: Not Hispanic Or Latino; Race: Black or African American Current Smoking Status: Patient has never smoked.   Tobacco Use Assessment Completed: Used Tobacco in last 30 days? Does not drink anymore.  Does not use drugs. Drinks 1 caffeinated drink per day. Has not had a blood transfusion.    REVIEW OF SYSTEMS:    GU Review Male:   Patient denies frequent urination, hard to postpone urination, burning/ pain with urination, get up at night to urinate, leakage of urine, stream starts and stops, trouble starting your stream, have to strain to urinate , erection problems, and  penile pain.  Gastrointestinal (Upper):   Patient denies nausea, vomiting, and indigestion/ heartburn.  Gastrointestinal (Lower):   Patient denies constipation and diarrhea.  Constitutional:   Patient denies fever, night sweats, weight loss, and fatigue.  Skin:   Patient denies skin rash/ lesion and itching.  Eyes:   Patient denies blurred vision and double vision.  Ears/ Nose/ Throat:   Patient  denies sore throat and sinus problems.  Hematologic/Lymphatic:   Patient denies swollen glands and easy bruising.  Cardiovascular:   Patient denies leg swelling and chest pains.  Respiratory:   Patient denies cough and shortness of breath.  Endocrine:   Patient denies excessive thirst.  Musculoskeletal:   Patient denies back pain and joint pain.  Neurological:   Patient denies headaches and dizziness.  Psychologic:   Patient denies depression and anxiety.   VITAL SIGNS:      12/09/2020 03:05 PM  Weight 156.0 lb / 70.76 kg  Height 65 in / 165.1 cm  BP 126/80 mmHg  Pulse 70 /min  BMI 26.0 kg/m   MULTI-SYSTEM PHYSICAL EXAMINATION:    Constitutional: Well-nourished. No physical deformities. Normally developed. Good grooming.  Neck: Neck symmetrical, not swollen. Normal tracheal position.  Respiratory: Normal breath sounds. No labored breathing, no use of accessory muscles.   Cardiovascular: Regular rate and rhythm. No murmur, no gallop.   Lymphatic: No enlargement of neck, axillae, groin.  Skin: No paleness, no jaundice, no cyanosis. No lesion, no ulcer, no rash.  Neurologic / Psychiatric: Oriented to time, oriented to place, oriented to person. No depression, no anxiety, no agitation.  Gastrointestinal: No mass, no tenderness, no rigidity, non obese abdomen.  Eyes: Normal conjunctivae. Normal eyelids.  Ears, Nose, Mouth, and Throat: Left ear no scars, no lesions, no masses. Right ear no scars, no lesions, no masses. Nose no scars, no lesions, no masses. Normal hearing. Normal lips.  Musculoskeletal: Normal gait and station of head and neck.     Complexity of Data:  Records Review:   Previous Patient Records  Urine Test Review:   Urinalysis   12/09/20  Urinalysis  Urine Appearance Clear   Urine Color Yellow   Urine Glucose Neg mg/dL  Urine Bilirubin Neg mg/dL  Urine Ketones Neg mg/dL  Urine Specific Gravity 1.020   Urine Blood Neg ery/uL  Urine pH 6.5   Urine Protein Trace  mg/dL  Urine Urobilinogen 0.2 mg/dL  Urine Nitrites Neg   Urine Leukocyte Esterase Neg leu/uL   PROCEDURES:          Urinalysis - 81003 Dipstick Dipstick Cont'd  Color: Yellow Bilirubin: Neg mg/dL  Appearance: Clear Ketones: Neg mg/dL  Specific Gravity: 1.020 Blood: Neg ery/uL  pH: 6.5 Protein: Trace mg/dL  Glucose: Neg mg/dL Urobilinogen: 0.2 mg/dL    Nitrites: Neg    Leukocyte Esterase: Neg leu/uL    ASSESSMENT:      ICD-10 Details  1 GU:   Prostate Cancer - C61    PLAN:           Schedule Return Visit/Planned Activity: Keep Scheduled Appointment - Schedule Surgery          Document Letter(s):  Created for Patient: Clinical Summary         Notes:   There are no changes in the patients history or physical exam since last evaluation by Dr. Abner Black. Pt is scheduled to undergo RALP with BPLND on 12/29/20.   All pt's questions were answered to the best of my ability.  Next Appointment:      Next Appointment: 12/29/2020 12:00 PM    Appointment Type: Surgery     Location: [facility] [phone]    Provider: Rexene Black, M.D.    Reason for Visit: [facility]/EXT REC RALP WITH BPLA WITH RESIDENT       Signed by Mcarthur Rossetti, PA on 12/09/20 at 3:23 PM (EST)  Urology Preoperative H&P   Chief Complaint: prostate cancer  History of Present Illness: VANESSA ALESI is a 50 y.o. male with prostate cancer here for RALP with b/l PLND.     Past Medical History:  Diagnosis Date   Hyperlipidemia    past hx of borderline but recently controlled   Seborrhea 01/04/2012   Tinea pedis 01/04/2012    Past Surgical History:  Procedure Laterality Date   TRANSURETHRAL RESECTION OF PROSTATE N/A 11/05/2019   Procedure: TRANSURETHRAL RESECTION OF THE PROSTATE (TURP);  Surgeon: Lawrence Lima, MD;  Location: [facility] Main;  Service: Urology;  Laterality: N/A;   WISDOM TOOTH EXTRACTION      Allergies: No Known Allergies  Family History   Problem Relation Age of Onset   Hypertension Mother    Colon polyps Mother    Cancer Father 35       Multiple myeloma   Hypertension Father    Colon polyps Father    Colon cancer Neg Hx    Esophageal cancer Neg Hx    Rectal cancer Neg Hx    Stomach cancer Neg Hx     Social History:  reports that he has never smoked. He has never used smokeless tobacco. He reports that he does not drink alcohol and does not use drugs.  ROS: A complete review of systems was performed.  All systems are negative except for pertinent findings as noted.  Physical Exam:  Vital signs in last 24 hours: Temp:  [99.1 F (37.3 C)] 99.1 F (37.3 C) (11/28 1037) Pulse Rate:  [71] 71 (11/28 1037) BP: (136)/(76) 136/76 (11/28 1037) SpO2:  [97 %] 97 % (11/28 1037) Constitutional:  Alert and oriented, No acute distress Cardiovascular: Regular rate and rhythm Respiratory: Normal respiratory effort, Lungs clear bilaterally GI: Abdomen is soft, nontender, nondistended, no abdominal masses GU: No CVA tenderness Lymphatic: No lymphadenopathy Neurologic: Grossly intact, no focal deficits Psychiatric: Normal mood and affect  Laboratory Data:  No results for input(s): WBC, HGB, HCT, PLT in the last 72 hours.  No results for input(s): NA, K, CL, GLUCOSE, BUN, CALCIUM, CREATININE in the last 72 hours.  Invalid input(s): CO3   Results for orders placed or performed during the hospital encounter of 12/29/20 (from the past 24 hour(s))  SARS Coronavirus 2 by RT PCR (hospital order, performed in [facility] hospital lab) Nasopharyngeal Nasopharyngeal Swab     Status: None   Collection Time: 12/29/20 10:09 AM   Specimen: Nasopharyngeal Swab  Result Value Ref Range   SARS Coronavirus 2 NEGATIVE NEGATIVE  Type and screen [place]     Status: None   Collection Time: 12/29/20 10:30 AM  Result Value Ref Range   ABO/RH(D) O POS    Antibody Screen NEG    Sample Expiration       01/01/2021,2359 Performed at [facility], [place] [address] [place], [place] [address]   ABO/Rh     Status: None   Collection Time: 12/29/20 10:35 AM  Result Value Ref Range   ABO/RH(D)      Jenetta Downer  POS Performed at [facility], [place] [address] [place], [place] [address]    Recent Results (from the past 240 hour(s))  SARS Coronavirus 2 by RT PCR (hospital order, performed in [facility] hospital lab) Nasopharyngeal Nasopharyngeal Swab     Status: None   Collection Time: 12/29/20 10:09 AM   Specimen: Nasopharyngeal Swab  Result Value Ref Range Status   SARS Coronavirus 2 NEGATIVE NEGATIVE Final    Comment: (NOTE) SARS-CoV-2 target nucleic acids are NOT DETECTED.  The SARS-CoV-2 RNA is generally detectable in upper and lower respiratory specimens during the acute phase of infection. The lowest concentration of SARS-CoV-2 viral copies this assay can detect is 250 copies / mL. A negative result does not preclude SARS-CoV-2 infection and should not be used as the sole basis for treatment or other patient management decisions.  A negative result may occur with improper specimen collection / handling, submission of specimen other than nasopharyngeal swab, presence of viral mutation(s) within the areas targeted by this assay, and inadequate number of viral copies (<250 copies / mL). A negative result must be combined with clinical observations, patient history, and epidemiological information.  Fact Sheet for Patients:   StrictlyIdeas.no  Fact Sheet for Healthcare Providers: BankingDealers.co.za  This test is not yet approved or  cleared by the [place] FDA and has been authorized for detection and/or diagnosis of SARS-CoV-2 by FDA under an Emergency Use Authorization (EUA).  This EUA will remain in effect (meaning this test can be used) for the duration of the COVID-19 declaration under Section  564(b)(1) of the Act, 21 U.S.C. section 360bbb-3(b)(1), unless the authorization is terminated or revoked sooner.  Performed at [facility], [place] [address], [place], [place] [address]     Renal Function: No results for input(s): CREATININE in the last 168 hours. CrCl cannot be calculated (Unknown ideal weight.).  Radiologic Imaging: No results found.  I independently reviewed the above imaging studies.  Assessment and Plan ULYSSES ALPER is a 50 y.o. male with prostate cancer here for RALP with b/l PLND. Ok to proceed.     Matt R. Kennie Snedden MD 12/29/2020, 2:29 PM  [facility] Pager: [phone]): [phone]

## 2020-12-29 NOTE — Anesthesia Procedure Notes (Signed)
Date/Time: 12/29/2020 9:14 PM Performed by: Cynda Familia, CRNA Oxygen Delivery Method: Simple face mask Placement Confirmation: positive ETCO2 and breath sounds checked- equal and bilateral Dental Injury: Teeth and Oropharynx as per pre-operative assessment

## 2020-12-29 NOTE — Op Note (Signed)
Operative Note  Preoperative diagnosis:  1.  Localized prostate cancer  Postoperative diagnosis: 1.  Localized prostate cancer  Procedure(s): 1.  Robotic assisted laparoscopic radical prostatectomy 2.  Robotic assisted laparoscopic bilateral pelvic lymph node dissection  Surgeon: Rexene Alberts, MD  Assistants:   Aldine Contes, PGY-4  An assistant was required for this surgical procedure.  The duties of the assistant included but were not limited to suctioning, passing suture, camera manipulation, retraction.  This procedure would not be able to be performed without an Environmental consultant.   Anesthesia:  General  Complications:  None  EBL:  480ml  Specimens: 1.  Prostate with seminal vesicles 2.  Periprostatic fat 3. Bilateral pelvic lymph nodes  Drains/Catheters: 1.  18 French Foley catheter  Intraoperative findings:   Approximately 40 gram prostate.  No accessory pudendal vessels.  Successful nerve spare.  Excellent hemostasis.  Water-tight anastomosis without leak.  Indication:  [name] is a 50 y.o. malewho initially presented with an elevated PSA.  Prostate biopsy showed Gleason 3+4=7 prostate cancer involving the right side of the prostate.  Treatment options were discussed with him at length and he chose robotic assisted laparoscopic radical prostatectomy.  Bilateral pelvic lymph node dissection was planned due to his risk stratification.  Description of procedure: The indications, alternatives, benefits, and risks were discussed with the patient and informed symptoms obtained.  The patient was brought to the operating room table, positioned supine and secured to the bed with a safety strap.  All pressure points were carefully padded and pneumatic compression devices were placed on lower extremities.  After the administration of intravenous antibiotics and general endotracheal anesthesia, the patient was repositioned in the dorsal lithotomy position using well-padded Allen  stirrups.  The arms were carefully tucked at the patient's side and secured with padding.  The chest was secured in place with foam padding and cloth tape and the table was positioned in approximately 30 degree Trendelenburg.  A rectal exam showed the prostate to be 40cc, without nodulesand not fixed. The patient's abdomen, genitalia, and upper thighs were prepped and draped in the standard sterile manner.  A time was completed, verifying the correct patient, surgical procedure and positioning prior to beginning the procedure.  An 65 French urethral catheter was inserted to drain the bladder.  Pneumoperitoneum was introduced by placing a Veress needle into the abdomen superior to the umbilicus and insufflated with CO2 to a pressure of 15 mmHg. An 8 mm blunt tip trocar was placed just above the umbilicus.  The 0 degree camera was then passed under direct visualization.  The abdominal cavity was examined for any sign of injury, adhesions, and identification of anatomic landmarks.  The remainder of the trochars were placed which included 2 separate 8 millimeter robotic trochars which were placed 9 cm laterally and inferiorly to the initially placed camera trocar.  A 12 mm trocar was placed 8 mm lateral to the right robotic trocar.  A separate 8 mm robotic trocar was placed 8 cm lateral to the previously placed left robotic trocar on the left side.  A 5 mm trocar was placed to the right and well above the umbilicus which approximately 10 and 12 cm away from the right-sided trochars.  The robot was then docked.  I placed monopolar scissors in the right hand, a fenestrated bipolar in the left hand and a prograsp in the fourth arm.  The urachus and median umbilical ligament was divided and we developed the space of Retzius down  to the pubic bone.  I divided the parietal peritoneum laterally up to the vas deferens on each side.  Using the prograsp forcep to provide cranial traction on the urachus, the prostate was then  defatted above the prostatic vesicle junction, and the superficial dorsal venous complex was coagulated with bipolar and divided.  We submitted the periprostatic fat for pathological specimen.  The endopelvic fascia was sharply opened bilaterally and the levator muscle fibers were swept posterior laterally allowing for visualization of the deep dorsal venous complex and apex of the prostate.  The puboprostatic ligaments were sharply divided and care was taken to preserve the dorsal venous complex.  I secured the dorsal venous complex with an 0 Vicryl CT1 needle in a figure-of-eight fashion.  I then addressed the bladder neck with a 30 degree down lens. I identified the bladder neck by pulling a Foley catheter.  The fourth arm was applying cranial traction on the urachus.  I divided the anterior bladder neck musculature until I found the anterior bladder neck mucosa which was then incised. I identified the Foley catheter within, the balloon was deflated, we pulled the Foley catheter out into the operating field.  The assistant then used a grasper to apply traction to the Foley catheter and the surgical tech then placed a [place] on the catheter near the penis for optimal retraction. Given his prior TURP, the posterior plane was difficult to identify.  I then divided the lateral bladder neck mucosa in the posterior bladder neck mucosa.  I was well away from the bilateral ureteral orifices.  I divided the posterior bladder neck musculature.  At this time, I felt that I did the same prostate tissue on the posterior side given his prior TURP.  I then elected to transect a portion of this tissue and sent this off for final bladder neck margin.  Following this excision, there did not appear to be any prostatic tissue remaining in the bladder side.  I completed the posterior dissection and identified the bilateral vas deferens.  The bilateral vas deferens were then freed and divided.  I then freed the bilateral seminal  vesicles using blunt and sharp dissection.  I placed metal clips on the seminal vesicle vessels and avoided cautery around the seminal vesicles.  I then switched back to a 0 degree lens.  I initially peeled off the denomination fascia from the prostate however I then elected to divide the Denonvilliers fascia beneath the prostate and developed the prostate off the rectum.  This plane was bluntly developed towards the apex of the prostate.  I then addressed the pedicles, first starting with the right and then moving towards the left. I then did a nerve spare by dividing the lateral pelvic fascia off of the prostatic capsule laterally.  I then isolated the pedicles of the prostate and placed Weck clips on the pedicles and then divided the pedicles with cold scissors.  I continued to divide the neurovascular bundles off of the prostate out to the apex of the prostate. There was some bleeding on the left during this dissection that was controlled.  At this point the prostate was essentially freed up except for the urethra.   I then addressed the prostate anteriorly, dividing the dorsal vein with cautery.  The anterior urethra was then sharply divided with cold scissors.  The Foley catheter was then pulled back and we divided the posterior urethral wall. Patent venous sinuses were then oversewn with a 4-0 Vicryl suture.  The specimen  was then placed in a Endo Catch bag and then the bag was placed in the upper abdomen out of the way.  I then irrigated the pelvis.  We performed a rectal test by instilling air into the rectal Foley.  The test was negative. We also inserted a gloved finger and we identified no evidence of rectal injury.  I then over sewed a few bleeders alongside the pedicles with a 4-0 Vicryl suture on an RB1 needle.  I then performed a bilateral pelvic lymph node dissection by incising the fascia overlying the right external iliac vein, dissecting distally.  I went just distal to the node of [place]  were replaced clips and then divided the lymphatics.  The lateral aspect of the dissection was the pelvic sidewall, inferior was the obturator nerve and proximal of the hypogastric vessels.  I placed clips at the proximal aspect and then divided the lymphatics.  The specimen was removed with the scope grasper and sent to pathology.  Grossly, there were no enlarged lymph nodes.  This was performed on both the right and left pelvic lymph nodes.  With good hemostasis confirmed, I then did the posterior reconstruction with a Rocco stitch.  I used a 3-0 Vloc double-armed suture on an RB1 needle.  I passed the sutures through the cut edges of Denonvilliers fascia beneath the bladder on the right side and through the posterior serrated sphincter underneath the urethra.  I ran this from right to left.  And then took the other end of the suture passing just proximal to the posterior bladder neck in the midline into the posterior serrated sphincter and around this from right to left using 3 throws and reapproximated the sutures.  I then completed the urethrovesical anastomosis using a 3-0 Vloc suture on an RB1 needle.  I passed both ends of the suture from the outside in through the bladder neck at the 6 o'clock position.  I passed both through the urethral stump from the inside out and the corresponding position.  I reapproximated the bladder neck to the urethra.  I then ran the left suture on the left side anastomosis to the 9 o'clock position.  And then went back to the right-sided suture around that up the right side of the 12 o'clock position.  I then continued the left suture to the 12 o'clock position. I identified the ureteral orifices and ensured that these were not incorporated with the sutures.  I then placed a new 18 French Foley catheter into the bladder and filled it with 10 cc of sterile water.  I then secured the knot and then passed the suture behind the pubic bone for anterior suspension.  The bladder  was irrigated with 200 cc of water.  There was no leak.  Hemostasis was excellent.  A 15 French drain was placed through the previously placed fourth arm.  We did place a Carter-Thomason 0 vicryl suture through the 12 mm assistant port. The robot was undocked and all the trochars were removed under direct vision.  Floseal and surgicel was applied in the pelvis.  I enlarged the umbilical trocar site large enough to remove the prostate and closed the fascia with 0 PDS sutures in a running fashion.  All the port sites were irrigated.  Exparel was injected to the trocar sites.  The skin was closed with 4-0 Monocryl in a running subcuticular fashion.  Skin glue was applied.  At this point, the patient was extubated and awakened in the operating  room and taken to recovery room in stable condition.  There were no immediate complications.  All counts were correct.  Plan: Admit for observation overnight.  Clear liquids tonight.  Regular for breakfast tomorrow.  Anticipate discharge home tomorrow.  Matt R. [facility]  Pager: [phone]

## 2020-12-29 NOTE — Anesthesia Procedure Notes (Signed)
Procedure Name: Intubation Date/Time: 12/29/2020 3:19 PM Performed by: Lissa Morales, CRNA Pre-anesthesia Checklist: Patient identified, Emergency Drugs available, Suction available and Patient being monitored Patient Re-evaluated:Patient Re-evaluated prior to induction Oxygen Delivery Method: Circle system utilized Preoxygenation: Pre-oxygenation with 100% oxygen Induction Type: IV induction Ventilation: Mask ventilation without difficulty Laryngoscope Size: Mac and 4 Grade View: Grade II Tube type: Oral Tube size: 7.5 mm Number of attempts: 1 Airway Equipment and Method: Stylet and Oral airway Placement Confirmation: ETT inserted through vocal cords under direct vision, positive ETCO2 and breath sounds checked- equal and bilateral Secured at: 23 cm Tube secured with: Tape Dental Injury: Teeth and Oropharynx as per pre-operative assessment  Difficulty Due To: Difficult Airway- due to anterior larynx Comments: Able to see posterior arytenoids

## 2020-12-29 NOTE — Transfer of Care (Signed)
Immediate Anesthesia Transfer of Care Note  Patient: [name]  Procedure(s) Performed: XI ROBOTIC ASSISTED LAPAROSCOPIC RADICAL PROSTATECTOMY LYMPHADENECTOMY/ PELVIC (Bilateral)  Patient Location: PACU  Anesthesia Type:General  Level of Consciousness: awake and alert   Airway & Oxygen Therapy: Patient Spontanous Breathing and Patient connected to face mask oxygen  Post-op Assessment: Report given to RN and Post -op Vital signs reviewed and stable  Post vital signs: Reviewed and stable  Last Vitals:  Vitals Value Taken Time  BP 130/87 12/29/20 2124  Temp    Pulse 88 12/29/20 2126  Resp 17 12/29/20 2126  SpO2 100 % 12/29/20 2126  Vitals shown include unvalidated device data.  Last Pain:  Vitals:   12/29/20 1037  TempSrc: Oral         Complications: No notable events documented.

## 2020-12-30 ENCOUNTER — Other Ambulatory Visit: Payer: Self-pay

## 2020-12-30 ENCOUNTER — Encounter (HOSPITAL_COMMUNITY): Payer: Self-pay | Admitting: Urology

## 2020-12-30 DIAGNOSIS — C61 Malignant neoplasm of prostate: Secondary | ICD-10-CM | POA: Diagnosis not present

## 2020-12-30 LAB — BASIC METABOLIC PANEL
Anion gap: 7 (ref 5–15)
Anion gap: 6 (ref 5–15)
BUN: 13 mg/dL (ref 6–20)
BUN: 12 mg/dL (ref 6–20)
CO2: 23 mmol/L (ref 22–32)
CO2: 25 mmol/L (ref 22–32)
Calcium: 8.9 mg/dL (ref 8.9–10.3)
Calcium: 8.4 mg/dL — ABNORMAL LOW (ref 8.9–10.3)
Chloride: 102 mmol/L (ref 98–111)
Chloride: 101 mmol/L (ref 98–111)
Creatinine, Ser: 0.86 mg/dL (ref 0.61–1.24)
Creatinine, Ser: 0.72 mg/dL (ref 0.61–1.24)
GFR, Estimated: >60 mL/min (ref >60)
GFR, Estimated: >60 mL/min (ref >60)
Glucose, Bld: 144 mg/dL — ABNORMAL HIGH (ref 70–99)
Glucose, Bld: 112 mg/dL — ABNORMAL HIGH (ref 70–99)
Potassium: 4 mmol/L (ref 3.5–5.1)
Potassium: 3.6 mmol/L (ref 3.5–5.1)
Sodium: 132 mmol/L — ABNORMAL LOW (ref 135–145)
Sodium: 132 mmol/L — ABNORMAL LOW (ref 135–145)

## 2020-12-30 LAB — CBC
HCT: 34.9 % — ABNORMAL LOW (ref 39.0–52.0)
Hemoglobin: 12.2 g/dL — ABNORMAL LOW (ref 13.0–17.0)
MCH: 30.9 pg (ref 26.0–34.0)
MCHC: 35 g/dL (ref 30.0–36.0)
MCV: 88.4 fL (ref 80.0–100.0)
Platelets: 255 10*3/uL (ref 150–400)
RBC: 3.95 MIL/uL — ABNORMAL LOW (ref 4.22–5.81)
RDW: 11.9 % (ref 11.5–15.5)
WBC: 16.5 10*3/uL — ABNORMAL HIGH (ref 4.0–10.5)
nRBC: 0 % (ref 0.0–0.2)

## 2020-12-30 MED ORDER — OXYCODONE-ACETAMINOPHEN 5-325 MG PO TABS: 1.0000 | ORAL_TABLET | ORAL | 0 refills | Status: DC | PRN

## 2020-12-30 MED ORDER — CHLORHEXIDINE GLUCONATE CLOTH 2 % EX PADS
6.0000 | MEDICATED_PAD | Freq: Every day | CUTANEOUS | Status: DC
  Administered 2020-12-30: 6 via TOPICAL

## 2020-12-30 MED ORDER — CHLORHEXIDINE GLUCONATE CLOTH 2 % EX PADS: 6.0000 | MEDICATED_PAD | Freq: Every day | CUTANEOUS | Status: DC

## 2020-12-30 MED ORDER — HEPARIN SODIUM (PORCINE) 5000 UNIT/ML IJ SOLN: 5000.0000 [IU] | Freq: Three times a day (TID) | INTRAMUSCULAR | Status: DC

## 2020-12-30 NOTE — Progress Notes (Signed)
Patient ambulated in hallway with RN for approximately 100 feet; tolerated well.

## 2020-12-30 NOTE — Plan of Care (Signed)
  Problem: Education: Goal: Knowledge of General Education information will improve Description: Including pain rating scale, medication(s)/side effects and non-pharmacologic comfort measures 12/30/2020 1631 by Elizebeth Brooking, RN Outcome: Adequate for Discharge 12/30/2020 1631 by Elizebeth Brooking, RN Outcome: Adequate for Discharge   Problem: Activity: Goal: Risk for activity intolerance will decrease 12/30/2020 1631 by Elizebeth Brooking, RN Outcome: Adequate for Discharge 12/30/2020 1631 by Elizebeth Brooking, RN Outcome: Adequate for Discharge   Problem: Nutrition: Goal: Adequate nutrition will be maintained 12/30/2020 1631 by Elizebeth Brooking, RN Outcome: Adequate for Discharge 12/30/2020 1631 by Elizebeth Brooking, RN Outcome: Adequate for Discharge   Problem: Coping: Goal: Level of anxiety will decrease 12/30/2020 1631 by Elizebeth Brooking, RN Outcome: Adequate for Discharge 12/30/2020 1631 by Elizebeth Brooking, RN Outcome: Adequate for Discharge   Problem: Elimination: Goal: Will not experience complications related to bowel motility 12/30/2020 1631 by Elizebeth Brooking, RN Outcome: Adequate for Discharge 12/30/2020 1631 by Elizebeth Brooking, RN Outcome: Adequate for Discharge Goal: Will not experience complications related to urinary retention 12/30/2020 1631 by Elizebeth Brooking, RN Outcome: Adequate for Discharge 12/30/2020 1631 by Elizebeth Brooking, RN Outcome: Adequate for Discharge   Problem: Elimination: Goal: Will not experience complications related to urinary retention 12/30/2020 1631 by Elizebeth Brooking, RN Outcome: Adequate for Discharge 12/30/2020 1631 by Elizebeth Brooking, RN Outcome: Adequate for Discharge   Problem: Elimination: Goal: Will not experience complications related to bowel motility 12/30/2020 1631 by Elizebeth Brooking, RN Outcome: Adequate for Discharge 12/30/2020 1631  by Elizebeth Brooking, RN Outcome: Adequate for Discharge Goal: Will not experience complications related to urinary retention 12/30/2020 1631 by Elizebeth Brooking, RN Outcome: Adequate for Discharge 12/30/2020 1631 by Elizebeth Brooking, RN Outcome: Adequate for Discharge   Problem: Coping: Goal: Level of anxiety will decrease 12/30/2020 1631 by Elizebeth Brooking, RN Outcome: Adequate for Discharge 12/30/2020 1631 by Elizebeth Brooking, RN Outcome: Adequate for Discharge   Problem: Pain Managment: Goal: General experience of comfort will improve 12/30/2020 1631 by Elizebeth Brooking, RN Outcome: Adequate for Discharge 12/30/2020 1631 by Elizebeth Brooking, RN Outcome: Adequate for Discharge   Problem: Safety: Goal: Ability to remain free from injury will improve 12/30/2020 1631 by Elizebeth Brooking, RN Outcome: Adequate for Discharge 12/30/2020 1631 by Elizebeth Brooking, RN Outcome: Adequate for Discharge   Problem: Skin Integrity: Goal: Risk for impaired skin integrity will decrease 12/30/2020 1631 by Elizebeth Brooking, RN Outcome: Adequate for Discharge 12/30/2020 1631 by Elizebeth Brooking, RN Outcome: Adequate for Discharge

## 2020-12-30 NOTE — Progress Notes (Signed)
Chaplain provided Scientist, physiological, Healthcare POA document education.  Chaplain left paperwork with [name] and let him know how to get paperwork done outside of the hospital.   Chaplain is available to follow-up as needed.      12/30/20 1600  Clinical Encounter Type  Visited With Patient  Visit Type Initial;Social support

## 2020-12-30 NOTE — Progress Notes (Signed)
1 Day Post-Op Subjective: Pain controlled. No nausea or emesis. No flatus or BM. Tolerating sips.  Objective: Vital signs in last 24 hours: Temp:  [97.5 F (36.4 C)-99.1 F (37.3 C)] 98.8 F (37.1 C) (11/29 0612) Pulse Rate:  [71-106] 106 (11/29 0612) Resp:  [11-18] 18 (11/29 0612) BP: (101-142)/(48-87) 101/48 (11/29 0612) SpO2:  [97 %-100 %] 98 % (11/29 0612)  Intake/Output from previous day: 11/28 0701 - 11/29 0700 In: 2872.6 [I.V.:2622.6; IV Piggyback:250] Out: 775 [Urine:325; Blood:450] Intake/Output this shift: No intake/output data recorded.  UOP: 348ml with another 36ml clear yellow in bag JP with <45ml ss in bulb  Physical Exam:  General: Alert and oriented CV: RRR Lungs: Clear Abdomen: Soft, ND, ATTP; inc c/d/I Ext: NT, No erythema  Lab Results: Recent Labs    12/30/20 0436  HGB 12.2*  HCT 34.9*   BMET Recent Labs    12/30/20 0436  NA 132*  K 4.0  CL 102  CO2 23  GLUCOSE 144*  BUN 13  CREATININE 0.86  CALCIUM 8.9     Studies/Results: No results found.  Assessment/Plan: Prostate cancer s/p RALP with b/l PLND 12/29/2020  -Pain control prn -Clears for breakfast, regular for lunch -Foley to gravity -Reviewed labs, slight hyponatremia. Reactive leukocytosis. Hgb appropriate at 12.2. Will recheck BMP at noon. -JP to bulb. Will remove prior to discharge -OOB, amb, IS, SQH -Anticipate discharge home this PM.   LOS: 1 day   Matt R. Milia Warth MD 12/30/2020, 7:09 AM [facility]  Pager: [phone]

## 2020-12-30 NOTE — Discharge Summary (Addendum)
Date of admission: 12/29/2020  Date of discharge: 12/30/2020  Admission diagnosis: Prostate cancer   Discharge diagnosis: Prostate cancer   Secondary diagnoses:  Patient Active Problem List   Diagnosis Date Noted   Prostate cancer ([place]) 12/29/2020   Malignant neoplasm of prostate ([place]) 10/20/2020   Rectal bleeding 12/18/2019   BPH (benign prostatic hyperplasia) 11/05/2019   Pure hypercholesterolemia 11/11/2014   Thumb pain 07/09/2013   Preventative health care 01/04/2012   ECZEMA 04/01/2009    Procedures performed: Procedure(s): XI ROBOTIC ASSISTED LAPAROSCOPIC RADICAL PROSTATECTOMY LYMPHADENECTOMY/ PELVIC  History and Physical: For full details, please see admission history and physical. Briefly, [name] is a 50 y.o. year old patient with prostate cancer who presented for robotic assisted laparascopic prostatectomy and lymph node dissection.   Hospital Course: Patient tolerated the procedure well.  He was then transferred to the floor after an uneventful PACU stay.  His hospital course was uncomplicated. He foley had no issues with drainage and was clear yellow. He was initially on clears and tolerated this well so was advanced to a regular diet.  Morning labwork was unremarkable. On POD#1 he had met discharge criteria: was eating a regular diet, was up and ambulating independently,  pain was well controlled, was taught how to care for his foley catheter, and was ready to for discharge. He will follow up with Dr. Abner Greenspan for catheter removal    Laboratory values:  Recent Labs    12/30/20 0436  WBC 16.5*  HGB 12.2*  HCT 34.9*   Recent Labs    12/30/20 0436 12/30/20 1306  NA 132* 132*  K 4.0 3.6  CL 102 101  CO2 23 25  GLUCOSE 144* 112*  BUN 13 12  CREATININE 0.86 0.72  CALCIUM 8.9 8.4*   No results for input(s): LABPT, INR in the last 72 hours. No results for input(s): LABURIN in the last 72 hours. Results for orders placed or performed during the hospital  encounter of 12/29/20  SARS Coronavirus 2 by RT PCR (hospital order, performed in [facility] hospital lab) Nasopharyngeal Nasopharyngeal Swab     Status: None   Collection Time: 12/29/20 10:09 AM   Specimen: Nasopharyngeal Swab  Result Value Ref Range Status   SARS Coronavirus 2 NEGATIVE NEGATIVE Final    Comment: (NOTE) SARS-CoV-2 target nucleic acids are NOT DETECTED.  The SARS-CoV-2 RNA is generally detectable in upper and lower respiratory specimens during the acute phase of infection. The lowest concentration of SARS-CoV-2 viral copies this assay can detect is 250 copies / mL. A negative result does not preclude SARS-CoV-2 infection and should not be used as the sole basis for treatment or other patient management decisions.  A negative result may occur with improper specimen collection / handling, submission of specimen other than nasopharyngeal swab, presence of viral mutation(s) within the areas targeted by this assay, and inadequate number of viral copies (<250 copies / mL). A negative result must be combined with clinical observations, patient history, and epidemiological information.  Fact Sheet for Patients:   StrictlyIdeas.no  Fact Sheet for Healthcare Providers: BankingDealers.co.za  This test is not yet approved or  cleared by the [place] FDA and has been authorized for detection and/or diagnosis of SARS-CoV-2 by FDA under an Emergency Use Authorization (EUA).  This EUA will remain in effect (meaning this test can be used) for the duration of the COVID-19 declaration under Section 564(b)(1) of the Act, 21 U.S.C. section 360bbb-3(b)(1), unless the authorization is terminated or revoked  sooner.  Performed at [facility], [place] [address], [place], [place] [address]     Disposition: Home  Discharge instruction: The patient was instructed to be ambulatory but told to refrain from heavy lifting,  strenuous activity, or driving.   Discharge medications:  Allergies as of 12/30/2020   No Known Allergies      Medication List     TAKE these medications    docusate sodium 100 MG capsule Commonly known as: COLACE Take 100 mg by mouth daily.        Followup:

## 2021-01-03 LAB — SURGICAL PATHOLOGY

## 2021-02-18 ENCOUNTER — Other Ambulatory Visit: Payer: Self-pay

## 2021-02-18 ENCOUNTER — Ambulatory Visit: Payer: 59 | Attending: Urology

## 2021-02-18 DIAGNOSIS — R293 Abnormal posture: Secondary | ICD-10-CM | POA: Diagnosis not present

## 2021-02-18 DIAGNOSIS — M62838 Other muscle spasm: Secondary | ICD-10-CM | POA: Diagnosis not present

## 2021-02-18 DIAGNOSIS — N393 Stress incontinence (female) (male): Secondary | ICD-10-CM | POA: Diagnosis not present

## 2021-02-18 DIAGNOSIS — R279 Unspecified lack of coordination: Secondary | ICD-10-CM | POA: Insufficient documentation

## 2021-02-18 DIAGNOSIS — M6281 Muscle weakness (generalized): Secondary | ICD-10-CM | POA: Diagnosis not present

## 2021-02-18 NOTE — Patient Instructions (Addendum)
Scar tissue mobilization: The first phase of working on scar tissue is without lotion or oil. You will develop a feel for where your scar is restricted, or not moving as much as the skin around it. These are the areas you want to focus on. Begin by placing fingers on scar tissue; gently press the skin gently in one direction. Do not let your fingers slide over the skin, but pull any adhesions under it. Maintain pressure for several seconds. Perform this technique in different directions over each restricted spot you find. Pressure does not need to be intense, but only comfortable. Perform for 5-10 minutes daily.  Scar tissue desensitization: In order to decrease the sensitivity of scar tissue, use oil or lotion (perform after scar tissue mobilization). Very gently massage over and around scar, not increasing discomfort at all. Perform for several minutes daily.   Access Code: [email or web address] URL: [email or web address] Date: 02/18/2021 Prepared by: Heather Roberts  Exercises Supine Diaphragmatic Breathing - 5 x daily - 7 x weekly - 1 sets - 10 reps Supine Lower Trunk Rotation - 1 x daily - 7 x weekly - 3 sets - 10 reps Supine Butterfly Groin Stretch - 1 x daily - 7 x weekly - 1 sets - 2 reps - 60 hold Child's Pose Stretch - 1 x daily - 7 x weekly - 1 sets - 2 reps - [address] hold  [facility] [address], [place], [place] [address] Phone # [phone] Fax [phone]

## 2021-02-18 NOTE — Therapy (Addendum)
[place] @ [place], [place], [address] Phone: [phone]   Fax:  [phone]  Physical Therapy Evaluation  Patient Details  Name: [name] MRN: [id number] Date of Birth: [birth year] Referring Provider (PT): Rexene Alberts MD   Encounter Date: 02/18/2021   PT End of Session - 02/18/21 0852     Visit Number 1    Date for PT Re-Evaluation 04/29/21    Authorization Type [employer]    PT Start Time 0800    PT Stop Time 0845    PT Time Calculation (min) 45 min    Activity Tolerance Patient tolerated treatment well    Behavior During Therapy [facility] for tasks assessed/performed             Past Medical History:  Diagnosis Date   Hyperlipidemia    past hx of borderline but recently controlled   Seborrhea 01/04/2012   Tinea pedis 01/04/2012    Past Surgical History:  Procedure Laterality Date   LYMPHADENECTOMY Bilateral 12/29/2020   Procedure: LYMPHADENECTOMY/ PELVIC;  Surgeon: Janith Lima, MD;  Location: [facility];  Service: Urology;  Laterality: Bilateral;   ROBOT ASSISTED LAPAROSCOPIC RADICAL PROSTATECTOMY N/A 12/29/2020   Procedure: XI ROBOTIC ASSISTED LAPAROSCOPIC RADICAL PROSTATECTOMY;  Surgeon: Janith Lima, MD;  Location: [facility];  Service: Urology;  Laterality: N/A;   TRANSURETHRAL RESECTION OF PROSTATE N/A 11/05/2019   Procedure: TRANSURETHRAL RESECTION OF THE PROSTATE (TURP);  Surgeon: Janith Lima, MD;  Location: [facility];  Service: Urology;  Laterality: N/A;   WISDOM TOOTH EXTRACTION      There were no vitals filed for this visit.    Subjective Assessment - 02/18/21 0800     Subjective Pt recovering from RALP on 12/29/20. He states that he has been taking it really slowly and he is seeing progress overall. At first he couldn't even move or roll over in bed, but about 5 weeks after surgery he states that he was able to start driving, going to work, and performing normal  activities with family. He states he will still have soreness in abdomen around scar tissue and in pelvis where prostate was. He states that urinary leaking is not bad; he has incontinence after sitting for prolonged time and gets up or changes position. He has a 51 and 51 year old.    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 30 minutes    How long can you stand comfortably? no limitation    How long can you walk comfortably? no limitation - regular walking for exercise    Patient Stated Goals get back to all activities without discomfort; play basketball with kids    Currently in Pain? Yes    Pain Score 2     Pain Location Pelvis    Pain Orientation Mid    Pain Descriptors / Indicators Discomfort;Sore;Aching    Pain Onset More than a month ago    Pain Frequency Occasional    Aggravating Factors  prolonged sitting, specific movement/stretches (bending/twisting)    Pain Relieving Factors nothing, pain goes away quickly    Effect of Pain on Daily Activities energy limitation, not pain    Multiple Pain Sites No                [facility] PT Assessment - 02/18/21 0001  Assessment   Medical Diagnosis stress incontinence - N39.3, M62.81 - muscle weakness, M62.89 - Other disorder of muscles    Referring Provider (PT) Rexene Alberts MD    Onset Date/Surgical Date 12/29/20    Next MD Visit 04/01/21    Prior Therapy yes      Precautions   Precautions None      Restrictions   Weight Bearing Restrictions No      Balance Screen   Has the patient fallen in the past 6 months No    Has the patient had a decrease in activity level because of a fear of falling?  No    Is the patient reluctant to leave their home because of a fear of falling?  No      Home Ecologist residence    Living Arrangements Spouse/significant  other;Children      Prior Function   Level of Independence Independent    Vocation Full time Geophysicist/field seismologist at [employer]   Overall Cognitive Status Within Functional Limits for tasks assessed      Functional Tests   Functional tests Squat;Single leg stance      Squat   Comments Rt rotation/weight shift      Single Leg Stance   Comments >10 sec bil, compensated trendelenburg Rt LE for balance      Posture/Postural Control   Posture Comments rigid posture      ROM / Strength   AROM / PROM / Strength AROM;Strength      AROM   Overall AROM Comments Lumbar: all A/ROM reduced by 50%      Strength   Overall Strength Comments Hip: Rt flexion 3+/5, Lt flexion 4/5, Bil abduction/adduction/extension 4-/5      Flexibility   Soft Tissue Assessment /Muscle Length yes   bil hip flexors, piriformis, hamstrings (reduced by 60%) all limited     Palpation   Palpation comment Notable abdominal scar tissue/myofascial restriction, most significant at midline and surrounding incisions, limiting diaphragmatic breath and overall mobility                 No emotional/communication barriers or cognitive limitation. Patient is motivated to learn. Patient understands and agrees with treatment goals and plan. PT explains patient will be examined in standing, sitting, and lying down to see how their muscles and joints work. When they are ready, they will be asked to remove their underwear so PT can examine their perineum. The patient is also given the option of providing their own chaperone as one is not provided in our facility. The patient also has the right and is explained the right to defer or refuse any part of the evaluation or treatment including the internal exam. With the patient's consent, PT will use one gloved finger to gently assess the muscles of the pelvic floor, seeing how well it contracts and relaxes and if there is muscle symmetry. After, the patient will get  dressed and PT and patient will discuss exam findings and plan of care. PT and patient discuss plan of care, schedule, attendance policy and HEP activities.        Objective measurements completed on examination: See above findings.     Pelvic Floor Special Questions - 02/18/21 0001     Prior Pelvic/Prostate Exam Yes    Are you Pregnant or attempting pregnancy? --    Currently Sexually Active No   not been able to get erection  since surgery   History of sexually transmitted disease No    Urinary Leakage Yes    How often 1x/day    Pad use yes   1 a day   Activities that cause leaking Bending;Lifting;Other   change of position   Urinary urgency Yes   standing in front of urinal; at night, waking every couple of hours to prevent urgency and possible leaking   Urinary frequency every 1.5-2 hours    Fecal incontinence No   no constipation - he did have issues right after surgery, but nothing in the last several weeks   Fluid intake 30-40oz of water a day    Caffeine beverages yes - tea    Skin Integrity Intact    Pelvic Floor Internal Exam Pt identity confirmed and verbal consent for internal pelvic floor exam provided    Exam Type Rectal    Sensation hypersensitivity    Palpation pain with minimal insertion; unable to reach deep pelvic floor muscles; bracing/guarding    Strength Flicker   pt required significant cuing in order to achieve 1/5 strength due to signfiicant muscle spasm; unable to achieve full A/ROM   Strength # of reps 0    Strength # of seconds 0    Tone High              [facility] Adult PT Treatment/Exercise - 02/18/21 0001       Bed Mobility   Bed Mobility Rolling Right;Rolling Left;Left Sidelying to Sit;Supine to Sit   rigid and breath holding; guarding; went over breath coordination and appropriate body mechanics with rolling and supine<>sit     Self-Care   Self-Care Other Self-Care Comments   self-abdominal scar tissue mobilization/desensitization      Neuro Re-ed    Neuro Re-ed Details  Diaphragmatic breathing with VC/TCs for better abdominal/pelvic floor expansion      Exercises   Exercises Lumbar      Lumbar Exercises: Stretches   Lower Trunk Rotation 5 reps;10 seconds   VC/TCs to encourage with diaphragmatic breathing and overall relaxation (down training)   Other Lumbar Stretch Exercise Child's pose for down training - 2 min with VC/TCs for appropriate abdominal expansion; Butterfly pose for down training - 2 min with VC/TCs for overall relaxation and diaphragmatic breathing                     PT Education - 02/18/21 0851     Education Details Pt education performed on pelvic floor/core anatomy, importance of improving scar tissue mobility and high abdominal/pelvic floor muscle tone before strengthening, self-scar tissue mobilization, and initial HEP. Access Code: [email or web address]    Person(s) Educated Patient    Methods Explanation    Comprehension Verbalized understanding              PT Short Term Goals - 02/18/21 0921       PT SHORT TERM GOAL #1   Title Pt will be independent with initial HEP in 2 weeks.    Time 4    Period Weeks    Status New    Target Date 03/18/21      PT SHORT TERM GOAL #2   Title Pt will demonstrate improved abdominal moiblity and diaphragmatic breathing in order to mobilize scar tissue and promote pelvic floor relaxation/mobility.    Time 4    Period Weeks    Status New    Target Date 03/18/21      PT SHORT TERM GOAL #3  Title Pt will incresae lumbar A/ROM in all directions by 25% in order to help establish better length tension relationship with lumbopelvic mobility.    Time 4    Period Weeks    Status New    Target Date 03/18/21               PT Long Term Goals - 02/18/21 0924       PT LONG TERM GOAL #1   Title Pt will be independent with advanced HEP.    Time 10    Period Weeks    Status New    Target Date 04/29/21      PT LONG TERM GOAL #2   Title Pt will  improve pelvic floor muscle tone and increase strength to 3/5 in order to decrease urinary incontinence to <1 episode a week.    Time 10    Period Weeks    Status New    Target Date 04/29/21      PT LONG TERM GOAL #3   Title Pt will report improvements in ability to achieve erection in order to participate in intimate activities with partner.    Time 10    Period Weeks    Status New    Target Date 04/29/21      PT LONG TERM GOAL #4   Title Pt will demonstrate increase in all impaired hip muscle strength by one muscle grade in order to help improve functoinal ability and energy with daily activities.    Time 10    Period Weeks    Status New    Target Date 04/29/21                    Plan - 02/18/21 0903     Clinical Impression Statement Pt is a 51 year old male with chief complaint of weakness, energy deficits, and urinary incontinence since RALP 12/29/20. Exam findings notable for impaired transfers and bed mobility with poor pressure management, reduced lumbar A/ROM and overall mobility, guarding/rigid postures, significant abdominal scar tissue/myofascial restriction, Bil hip weakness, decreased anterior/posterior Bil LE flexibility, poor pelvic floor coordination, pelvic floor weakness 1/5 after cuing, significnat pelvic floor tone and guarding with hypersensitivity present. Signs and symptoms most consistent with muscle guarding/tightness and scar tissue/myofascial restriction with assoicated decrease in mobility and weakness s/p surgery. Pt tolerated initial treatment of self-scar tissue mobilization and gentle stretches well demonstrated by no increase in pain and verbalized understanding of home instructions. He will benefit from skilled PT intervention in order to address impairments, improve overall strength and moiblity, decrease urinary incontinence, and improve QOL.    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,  Transurethral resection of prostate 2021    Examination-Activity Limitations Bed Mobility;Bend;Continence;Squat;Sit    Examination-Participation Restrictions Community Activity;Interpersonal Relationship    Stability/Clinical Decision Making Stable/Uncomplicated    Clinical Decision Making Low    Rehab Potential Fair    PT Frequency 1x / week    PT Duration 12 weeks    PT Treatment/Interventions ADLs/Self Care Home Management;Cryotherapy;Electrical Stimulation;Biofeedback;Moist Heat;Therapeutic activities;Therapeutic exercise;Neuromuscular re-education;Manual techniques;Patient/family education;Scar mobilization;Passive range of motion;Dry needling;Spinal Manipulations    PT Next Visit Plan Plan to perform manual techniques to reduce abdominal scar tissue/myofascial restriction; progress down training and stretches.    PT Home Exercise Plan Access Code: [email or web address]    Consulted and Agree with Plan of Care Patient             Patient  will benefit from skilled therapeutic intervention in order to improve the following deficits and impairments:  Decreased coordination, Decreased range of motion, Increased fascial restricitons, Impaired tone, Decreased endurance, Increased muscle spasms, Pain, Decreased activity tolerance, Decreased scar mobility, Hypomobility, Impaired flexibility, Improper body mechanics, Decreased mobility, Decreased strength, Postural dysfunction  Visit Diagnosis: Other muscle spasm - Plan: PT plan of care cert/re-cert  Muscle weakness (generalized) - Plan: PT plan of care cert/re-cert  Unspecified lack of coordination - Plan: PT plan of care cert/re-cert  Abnormal posture - Plan: PT plan of care cert/re-cert     Problem List Patient Active Problem List   Diagnosis Date Noted   Prostate cancer ([facility]) 12/29/2020   Malignant neoplasm of prostate ([place]) 10/20/2020   Rectal bleeding 12/18/2019   BPH (benign prostatic hyperplasia) 11/05/2019   Pure hypercholesterolemia  11/11/2014   Thumb pain 07/09/2013   Preventative health care 01/04/2012   ECZEMA 04/01/2009    Heather Roberts, PT, DPT01/18/235:15 PM   [place] @ [place], [place], [address] Phone: [phone]   Fax:  [phone]  Name: [name] MRN: [id number] Date of Birth: 24-Mar-1970

## 2021-02-24 ENCOUNTER — Ambulatory Visit: Payer: 59

## 2021-02-24 ENCOUNTER — Other Ambulatory Visit: Payer: Self-pay

## 2021-02-24 DIAGNOSIS — M62838 Other muscle spasm: Secondary | ICD-10-CM

## 2021-02-24 DIAGNOSIS — M6281 Muscle weakness (generalized): Secondary | ICD-10-CM

## 2021-02-24 DIAGNOSIS — R293 Abnormal posture: Secondary | ICD-10-CM | POA: Diagnosis not present

## 2021-02-24 DIAGNOSIS — R279 Unspecified lack of coordination: Secondary | ICD-10-CM | POA: Diagnosis not present

## 2021-02-24 DIAGNOSIS — N393 Stress incontinence (female) (male): Secondary | ICD-10-CM | POA: Diagnosis not present

## 2021-02-24 NOTE — Therapy (Signed)
[place] @ [place], [place], [address] Phone: [phone]   Fax:  [phone]  Physical Therapy Treatment  Patient Details  Name: [name] MRN: [id number] Date of Birth: 07/11/1970 Referring Provider (PT): Rexene Alberts MD   Encounter Date: 02/24/2021   PT End of Session - 02/24/21 0839     Visit Number 2    Date for PT Re-Evaluation 04/29/21    Authorization Type [employer]    PT Start Time 0810    PT Stop Time 0842    PT Time Calculation (min) 32 min    Activity Tolerance Patient tolerated treatment well    Behavior During Therapy [facility] for tasks assessed/performed             Past Medical History:  Diagnosis Date   Hyperlipidemia    past hx of borderline but recently controlled   Seborrhea 01/04/2012   Tinea pedis 01/04/2012    Past Surgical History:  Procedure Laterality Date   LYMPHADENECTOMY Bilateral 12/29/2020   Procedure: LYMPHADENECTOMY/ PELVIC;  Surgeon: Janith Lima, MD;  Location: [facility];  Service: Urology;  Laterality: Bilateral;   ROBOT ASSISTED LAPAROSCOPIC RADICAL PROSTATECTOMY N/A 12/29/2020   Procedure: XI ROBOTIC ASSISTED LAPAROSCOPIC RADICAL PROSTATECTOMY;  Surgeon: Janith Lima, MD;  Location: [facility];  Service: Urology;  Laterality: N/A;   TRANSURETHRAL RESECTION OF PROSTATE N/A 11/05/2019   Procedure: TRANSURETHRAL RESECTION OF THE PROSTATE (TURP);  Surgeon: Janith Lima, MD;  Location: [facility];  Service: Urology;  Laterality: N/A;   WISDOM TOOTH EXTRACTION      There were no vitals filed for this visit.   Subjective Assessment - 02/24/21 0811     Subjective Pt states that he is doing overall about the same; he is not currently having any pain. He has been working on exercises an abdominal massage every day.    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 No/denies    Multiple  Pain Sites No                               [facility] Adult PT Treatment/Exercise - 02/24/21 0001       Lumbar Exercises: Stretches   Other Lumbar Stretch Exercise Bent knee fall out for anterior hip/abdomen mobility, 10x B with breath coordination      Lumbar Exercises: Supine   Bridge 20 reps   Exhale lift, inhale down   Other Supine Lumbar Exercises Supine march with transversus activation - inhale up, exhale down    Other Supine Lumbar Exercises core activation with horizontal abduction and breath coordiantion      Manual Therapy   Manual Therapy Soft tissue mobilization;Myofascial release    Soft tissue mobilization abdominal sof ttissue mobilization to improve mobility and help muscles relax    Myofascial Release scar tissue mobilization in abdomen with deepbreathing                     PT Education - 02/24/21 0814     Education Details Pt education performed on scar tissue mobilization at home and that he is notdoing incorrectly - any manipulation of tissue of beneficial. We arenot updating HEP this session since he is already feeling challenged with what he is doing at home currently. [email or web address]    Person(s) Educated Patient    Methods Explanation;Demonstration;Tactile  cues;Verbal cues    Comprehension Verbalized understanding              PT Short Term Goals - 02/18/21 0921       PT SHORT TERM GOAL #1   Title Pt will be independent with initial HEP in 2 weeks.    Time 4    Period Weeks    Status New    Target Date 03/18/21      PT SHORT TERM GOAL #2   Title Pt will demonstrate improved abdominal moiblity and diaphragmatic breathing in order to mobilize scar tissue and promote pelvic floor relaxation/mobility.    Time 4    Period Weeks    Status New    Target Date 03/18/21      PT SHORT TERM GOAL #3   Title Pt will incresae lumbar A/ROM in all directions by 25% in order to help establish better length tension relationship with  lumbopelvic mobility.    Time 4    Period Weeks    Status New    Target Date 03/18/21               PT Long Term Goals - 02/18/21 0924       PT LONG TERM GOAL #1   Title Pt will be independent with advanced HEP.    Time 10    Period Weeks    Status New    Target Date 04/29/21      PT LONG TERM GOAL #2   Title Pt will improve pelvic floor muscle tone and increase strength to 3/5 in order to decrease urinary incontinence to <1 episode a week.    Time 10    Period Weeks    Status New    Target Date 04/29/21      PT LONG TERM GOAL #3   Title Pt will report improvements in ability to achieve erection in order to participate in intimate activities with partner.    Time 10    Period Weeks    Status New    Target Date 04/29/21      PT LONG TERM GOAL #4   Title Pt will demonstrate increase in all impaired hip muscle strength by one muscle grade in order to help improve functoinal ability and energy with daily activities.    Time 10    Period Weeks    Status New    Target Date 04/29/21                   Plan - 02/24/21 0835     Clinical Impression Statement Pt overall doing very well and compliantwith initial HEP; he was assured that he isperforming abdominal scar tissue massage correctly. Mild improvements in scar tissue and generalized abdominal mobility during manual techniques this session with good tolerance reported by patient. He is demonstrating less apprehension with movement and improving breath coordination. Good tolerance to addition of gentle core and hip strengthening demosntrated by no increase in pain reported and appropriate fatigue. We discussed performing one minute fast walking intervals during normal walks to increase cardiovascular fitness and energy. He will continue to benefit from skilled PT intervention in order to work towards goals and improve QOL.    PT Treatment/Interventions ADLs/Self Care Home Management;Cryotherapy;Electrical  Stimulation;Biofeedback;Moist Heat;Therapeutic activities;Therapeutic exercise;Neuromuscular re-education;Manual techniques;Patient/family education;Scar mobilization;Passive range of motion;Dry needling;Spinal Manipulations    PT Next Visit Plan Continue manual techniques to improve abdominal restriction; progress mobility and gentle strengthening exercises; discuss reducing pelvic floor clenching and  breathing during all activities.    PT Home Exercise Plan Access Code: [email or web address]    Consulted and Agree with Plan of Care Patient             Patient will benefit from skilled therapeutic intervention in order to improve the following deficits and impairments:  Decreased coordination, Decreased range of motion, Increased fascial restricitons, Impaired tone, Decreased endurance, Increased muscle spasms, Pain, Decreased activity tolerance, Decreased scar mobility, Hypomobility, Impaired flexibility, Improper body mechanics, Decreased mobility, Decreased strength, Postural dysfunction  Visit Diagnosis: Other muscle spasm  Muscle weakness (generalized)  Unspecified lack of coordination  Abnormal posture     Problem List Patient Active Problem List   Diagnosis Date Noted   Prostate cancer ([place]) 12/29/2020   Malignant neoplasm of prostate ([place]) 10/20/2020   Rectal bleeding 12/18/2019   BPH (benign prostatic hyperplasia) 11/05/2019   Pure hypercholesterolemia 11/11/2014   Thumb pain 07/09/2013   Preventative health care 01/04/2012   ECZEMA 04/01/2009    Heather Roberts, PT, DPT01/24/238:43 AM   [place] @ [place] [address] [place], [place], [address] Phone: [phone]   Fax:  [phone]  Name: [name] MRN: [id number] Date of Birth: 1970-05-02

## 2021-03-04 ENCOUNTER — Ambulatory Visit: Payer: 59 | Attending: Urology

## 2021-03-04 ENCOUNTER — Other Ambulatory Visit: Payer: Self-pay

## 2021-03-04 DIAGNOSIS — M62838 Other muscle spasm: Secondary | ICD-10-CM | POA: Diagnosis not present

## 2021-03-04 DIAGNOSIS — M6281 Muscle weakness (generalized): Secondary | ICD-10-CM | POA: Diagnosis not present

## 2021-03-04 DIAGNOSIS — R279 Unspecified lack of coordination: Secondary | ICD-10-CM | POA: Insufficient documentation

## 2021-03-04 DIAGNOSIS — R293 Abnormal posture: Secondary | ICD-10-CM | POA: Insufficient documentation

## 2021-03-04 NOTE — Therapy (Signed)
[facility] @ [facility] [place] [place], [place], [address] Phone: [phone]   Fax:  [phone]  Physical Therapy Treatment  Patient Details  Name: [name] MRN: [id number] Date of Birth: [birth year] Referring Provider (PT): Rexene Alberts MD   Encounter Date: 03/04/2021   PT End of Session - 03/04/21 1628     Visit Number 3    Date for PT Re-Evaluation 04/29/21    Authorization Type [employer]    PT Start Time 1616    PT Stop Time 1654    PT Time Calculation (min) 38 min    Activity Tolerance Patient tolerated treatment well    Behavior During Therapy [facility] for tasks assessed/performed             Past Medical History:  Diagnosis Date   Hyperlipidemia    past hx of borderline but recently controlled   Seborrhea 01/04/2012   Tinea pedis 01/04/2012    Past Surgical History:  Procedure Laterality Date   LYMPHADENECTOMY Bilateral 12/29/2020   Procedure: LYMPHADENECTOMY/ PELVIC;  Surgeon: Janith [facility], MD;  Location: [facility];  Service: Urology;  Laterality: Bilateral;   ROBOT ASSISTED LAPAROSCOPIC RADICAL PROSTATECTOMY N/A 12/29/2020   Procedure: XI ROBOTIC ASSISTED LAPAROSCOPIC RADICAL PROSTATECTOMY;  Surgeon: Janith [facility], MD;  Location: [facility];  Service: Urology;  Laterality: N/A;   TRANSURETHRAL RESECTION OF PROSTATE N/A 11/05/2019   Procedure: TRANSURETHRAL RESECTION OF THE PROSTATE (TURP);  Surgeon: Janith [facility], MD;  Location: [facility];  Service: Urology;  Laterality: N/A;   WISDOM TOOTH EXTRACTION      There were no vitals filed for this visit.   Subjective Assessment - 03/04/21 1617     Subjective Pt states that he has been working on exercises and abdominal massage. He states that he is feeling less abdominal tension and discomfort than before.                               [facility] Adult PT Treatment/Exercise - 03/04/21 0001       Self-Care   Self-Care Other  Self-Care Comments   urge suppression technique     Neuro Re-ed    Neuro Re-ed Details  Pelvic floor contraction training with VC/TCs 2 x 10; urge suppression technique quick flicks 3 x 5; trasnversus abdominus contraction training with 2 x 10 supine march and 10x bil leg extensions      Lumbar Exercises: Seated   Other Seated Lumbar Exercises Swiss ball mobility: bil circles 10x, lateral glides 10x, pelvic tilts 10x      Lumbar Exercises: Supine   Bridge with Ball Squeeze 20 reps    Other Supine Lumbar Exercises core activation with horizontal abduction and breath coordiantion      Lumbar Exercises: Sidelying   Clam 20 reps;Both                     PT Education - 03/04/21 1652     Education Details Pt education performed on all exercise progressions, urge suppression technique, pelvic floor strengthening, and updated HEP. [email or web address]    Person(s) Educated Patient    Methods Explanation;Demonstration;Tactile cues;Verbal cues;Handout    Comprehension Verbalized understanding              PT Short Term Goals - 02/18/21 0921       PT SHORT TERM GOAL #1   Title Pt will be independent  with initial HEP in 2 weeks.    Time 4    Period Weeks    Status New    Target Date 03/18/21      PT SHORT TERM GOAL #2   Title Pt will demonstrate improved abdominal moiblity and diaphragmatic breathing in order to mobilize scar tissue and promote pelvic floor relaxation/mobility.    Time 4    Period Weeks    Status New    Target Date 03/18/21      PT SHORT TERM GOAL #3   Title Pt will incresae lumbar A/ROM in all directions by 25% in order to help establish better length tension relationship with lumbopelvic mobility.    Time 4    Period Weeks    Status New    Target Date 03/18/21               PT Long Term Goals - 02/18/21 0924       PT LONG TERM GOAL #1   Title Pt will be independent with advanced HEP.    Time 10    Period Weeks    Status New    Target Date  04/29/21      PT LONG TERM GOAL #2   Title Pt will improve pelvic floor muscle tone and increase strength to 3/5 in order to decrease urinary incontinence to <1 episode a week.    Time 10    Period Weeks    Status New    Target Date 04/29/21      PT LONG TERM GOAL #3   Title Pt will report improvements in ability to achieve erection in order to participate in intimate activities with partner.    Time 10    Period Weeks    Status New    Target Date 04/29/21      PT LONG TERM GOAL #4   Title Pt will demonstrate increase in all impaired hip muscle strength by one muscle grade in order to help improve functoinal ability and energy with daily activities.    Time 10    Period Weeks    Status New    Target Date 04/29/21                   Plan - 03/04/21 1628     Clinical Impression Statement Pt is beginning to see progress with abdominal restriction and pain; he is observed to move and perform bed mobility with increased ease. Today focus placed on pelvic floor strengthening, urge suppression technique, and progression of core and hip strengthening with breath coordination. He also performed swiss ball activities for improved lumbopelvic mobility. He tolerated all exercise progressions well demonstrated by no increase in pain and continually improve movement patterns with less guarding. He will continue to benefit from skilled PT intervention in order to progress towards goal completion and improve QOL.    PT Treatment/Interventions ADLs/Self Care Home Management;Cryotherapy;Electrical Stimulation;Biofeedback;Moist Heat;Therapeutic activities;Therapeutic exercise;Neuromuscular re-education;Manual techniques;Patient/family education;Scar mobilization;Passive range of motion;Dry needling;Spinal Manipulations    PT Next Visit Plan Return to manual techniques to improve abdominal mobility; with either internal or external feedback, evaluate pelvic floor contraction for appropriate motor  control.    PT Home Exercise Plan Access Code: [email or web address]    Consulted and Agree with Plan of Care Patient             Patient will benefit from skilled therapeutic intervention in order to improve the following deficits and impairments:  Decreased coordination, Decreased range of motion, Increased  fascial restricitons, Impaired tone, Decreased endurance, Increased muscle spasms, Pain, Decreased activity tolerance, Decreased scar mobility, Hypomobility, Impaired flexibility, Improper body mechanics, Decreased mobility, Decreased strength, Postural dysfunction  Visit Diagnosis: Other muscle spasm  Muscle weakness (generalized)  Unspecified lack of coordination  Abnormal posture     Problem List Patient Active Problem List   Diagnosis Date Noted   Prostate cancer ([facility]) 12/29/2020   Malignant neoplasm of prostate ([place]) 10/20/2020   Rectal bleeding 12/18/2019   BPH (benign prostatic hyperplasia) 11/05/2019   Pure hypercholesterolemia 11/11/2014   Thumb pain 07/09/2013   Preventative health care 01/04/2012   ECZEMA 04/01/2009   Heather Roberts, PT, DPT02/01/234:58 PM   [facility] @ [address] [place], [place], [address] Phone: [phone]   Fax:  [phone]  Name: ROSALIE GELPI MRN: [id number] Date of Birth: 01-24-1971

## 2021-03-04 NOTE — Patient Instructions (Addendum)
Urge suppression technique: A technique to help you hold urine until its an appropriate time to go, whether this is making it home or trying to reach a specific voiding time frame according to your schedule. It helps to send signals from the bladder to the brain that say you dont actually have to void urine right now. This most likely only give you several minutes of relief at first, but repeat as needed; the benefit will last longer as you use this technique more and get into better bladder habits. ?  The technique: o Perform 5 quick flicks (Kegels) rapidly, not worrying about fully relaxing in between each (only in this technique). o Then perform several deep belly breaths while focusing on relaxing the pelvic floor. o Go do something else to help distract yourself from the urge to urinate. o Repeat as needed.  Access Code: [email or web address] URL: [email or web address] Date: 03/04/2021 Prepared by: Heather Roberts  Exercises Supine Diaphragmatic Breathing - 5 x daily - 7 x weekly - 1 sets - 10 reps Supine Lower Trunk Rotation - 1 x daily - 7 x weekly - 3 sets - 10 reps Supine Butterfly Groin Stretch - 1 x daily - 7 x weekly - 1 sets - 2 reps - 60 hold Child's Pose Stretch - 1 x daily - 7 x weekly - 1 sets - 2 reps - 60 hold Supine Pelvic Floor Contraction - 3 x daily - 7 x weekly - 2 sets - 10 reps Supine Transversus Abdominis Bracing with Leg Extension - 1 x daily - 7 x weekly - 2 sets - 10 reps Supine Bridge with Mini Swiss Ball Between Knees - 1 x daily - 7 x weekly - 2 sets - 10 reps Clamshell - 1 x daily - 7 x weekly - 2 sets - 10 reps Supine Shoulder Horizontal Abduction with Resistance - 1 x daily - 7 x weekly - 2 sets - 10 reps

## 2021-03-11 ENCOUNTER — Ambulatory Visit: Payer: 59

## 2021-03-11 ENCOUNTER — Other Ambulatory Visit: Payer: Self-pay

## 2021-03-11 DIAGNOSIS — R279 Unspecified lack of coordination: Secondary | ICD-10-CM

## 2021-03-11 DIAGNOSIS — M6281 Muscle weakness (generalized): Secondary | ICD-10-CM

## 2021-03-11 DIAGNOSIS — R293 Abnormal posture: Secondary | ICD-10-CM | POA: Diagnosis not present

## 2021-03-11 DIAGNOSIS — M62838 Other muscle spasm: Secondary | ICD-10-CM

## 2021-03-11 NOTE — Therapy (Signed)
[facility] @ [facility] [place], [place], [address] Phone: [phone]   Fax:  [phone]  Physical Therapy Treatment  Patient Details  Name: [name] MRN: [id number] Date of Birth: 24-May-1970 Referring Provider (PT): Rexene Alberts MD   Encounter Date: 03/11/2021   PT End of Session - 03/11/21 0908     Visit Number 4    Date for PT Re-Evaluation 04/29/21    Authorization Type [employer]    PT Start Time 0845    PT Stop Time 0928    PT Time Calculation (min) 43 min    Activity Tolerance Patient tolerated treatment well    Behavior During Therapy [facility] for tasks assessed/performed             Past Medical History:  Diagnosis Date   Hyperlipidemia    past hx of borderline but recently controlled   Seborrhea 01/04/2012   Tinea pedis 01/04/2012    Past Surgical History:  Procedure Laterality Date   LYMPHADENECTOMY Bilateral 12/29/2020   Procedure: LYMPHADENECTOMY/ PELVIC;  Surgeon: Janith [facility], MD;  Location: [facility];  Service: Urology;  Laterality: Bilateral;   ROBOT ASSISTED LAPAROSCOPIC RADICAL PROSTATECTOMY N/A 12/29/2020   Procedure: XI ROBOTIC ASSISTED LAPAROSCOPIC RADICAL PROSTATECTOMY;  Surgeon: Janith [facility], MD;  Location: [facility];  Service: Urology;  Laterality: N/A;   TRANSURETHRAL RESECTION OF PROSTATE N/A 11/05/2019   Procedure: TRANSURETHRAL RESECTION OF THE PROSTATE (TURP);  Surgeon: Janith [facility], MD;  Location: [facility];  Service: Urology;  Laterality: N/A;   WISDOM TOOTH EXTRACTION      There were no vitals filed for this visit.   Subjective Assessment - 03/11/21 0844     Subjective Pt states that he is feeling a little bit better. He is now going between an hour and a half to 2 and a half hours as voiding interval. He is finding urge suppression technique helpful. He has added in pelvic floor strengthening at other times.    Patient Stated Goals get back to all  activities without discomfort; play basketball with kids    Currently in Pain? No/denies    Multiple Pain Sites No                               [facility] Adult PT Treatment/Exercise - 03/11/21 0001       Neuro Re-ed    Neuro Re-ed Details  Transversus abdominus training with leg extensions 10x bil      Lumbar Exercises: Stretches   Other Lumbar Stretch Exercise Hip flexor stretch 60 sec Bil      Lumbar Exercises: Standing   Row Strengthening;Both;20 reps   red   Shoulder Extension Strengthening;20 reps;Theraband   red     Lumbar Exercises: Seated   Sit to Stand 20 reps   breath coordination     Lumbar Exercises: Supine   Bridge with Ball Squeeze 20 reps    Other Supine Lumbar Exercises core activation with horizontal abduction and breath coordiantion      Lumbar Exercises: Sidelying   Clam 20 reps;Both    Other Sidelying Lumbar Exercises Open books 10x Bil      Manual Therapy   Manual Therapy Soft tissue mobilization;Myofascial release    Soft tissue mobilization abdominal soft tissue mobilization to improve mobility and help muscles relax    Myofascial Release scar tissue mobilization in abdomen with deepbreathing  PT Education - 03/11/21 0902     Education Details Pt education performed on all exercise progressions, not being fearful of faster walking, and pelvic floor strengthening on regular basis. [email or web address]    Person(s) Educated Patient    Methods Explanation;Demonstration;Tactile cues;Verbal cues    Comprehension Verbalized understanding              PT Short Term Goals - 02/18/21 0921       PT SHORT TERM GOAL #1   Title Pt will be independent with initial HEP in 2 weeks.    Time 4    Period Weeks    Status New    Target Date 03/18/21      PT SHORT TERM GOAL #2   Title Pt will demonstrate improved abdominal moiblity and diaphragmatic breathing in order to mobilize scar tissue and promote pelvic floor  relaxation/mobility.    Time 4    Period Weeks    Status New    Target Date 03/18/21      PT SHORT TERM GOAL #3   Title Pt will incresae lumbar A/ROM in all directions by 25% in order to help establish better length tension relationship with lumbopelvic mobility.    Time 4    Period Weeks    Status New    Target Date 03/18/21               PT Long Term Goals - 02/18/21 0924       PT LONG TERM GOAL #1   Title Pt will be independent with advanced HEP.    Time 10    Period Weeks    Status New    Target Date 04/29/21      PT LONG TERM GOAL #2   Title Pt will improve pelvic floor muscle tone and increase strength to 3/5 in order to decrease urinary incontinence to <1 episode a week.    Time 10    Period Weeks    Status New    Target Date 04/29/21      PT LONG TERM GOAL #3   Title Pt will report improvements in ability to achieve erection in order to participate in intimate activities with partner.    Time 10    Period Weeks    Status New    Target Date 04/29/21      PT LONG TERM GOAL #4   Title Pt will demonstrate increase in all impaired hip muscle strength by one muscle grade in order to help improve functoinal ability and energy with daily activities.    Time 10    Period Weeks    Status New    Target Date 04/29/21                   Plan - 03/11/21 0903     Clinical Impression Statement Pt is making good progress demonstrated by improvements in strength and endurance during functional tasks as well as urinary urgency; he is now able to hold urine between 1.5-2.5 hours. Manual techniques performed to abdomen to continue working on addressing scar tissue restriction and limited myofascial mobility with someimprovements in mobility. He tolerated all previous exercise progressions and addition of standing strengthening well with no increase in pain; mobility exercises progressed to include open books and hip flexor stretch. He is requiring less frequent rest  breaks between exercises and during sets. He will continue to benefit from skilled PT intervention in order to progress functional strengthening and decrease urinary incontinence.  PT Treatment/Interventions ADLs/Self Care Home Management;Cryotherapy;Electrical Stimulation;Biofeedback;Moist Heat;Therapeutic activities;Therapeutic exercise;Neuromuscular re-education;Manual techniques;Patient/family education;Scar mobilization;Passive range of motion;Dry needling;Spinal Manipulations    PT Next Visit Plan Continue to progress functional strengthening; abdominal soft tissue/scar tissue techniques as needed.    PT Home Exercise Plan Access Code: [email or web address]    Consulted and Agree with Plan of Care Patient             Patient will benefit from skilled therapeutic intervention in order to improve the following deficits and impairments:  Decreased coordination, Decreased range of motion, Increased fascial restricitons, Impaired tone, Decreased endurance, Increased muscle spasms, Pain, Decreased activity tolerance, Decreased scar mobility, Hypomobility, Impaired flexibility, Improper body mechanics, Decreased mobility, Decreased strength, Postural dysfunction  Visit Diagnosis: Other muscle spasm  Muscle weakness (generalized)  Unspecified lack of coordination  Abnormal posture     Problem List Patient Active Problem List   Diagnosis Date Noted   Prostate cancer ([facility]) 12/29/2020   Malignant neoplasm of prostate ([place]) 10/20/2020   Rectal bleeding 12/18/2019   BPH (benign prostatic hyperplasia) 11/05/2019   Pure hypercholesterolemia 11/11/2014   Thumb pain 07/09/2013   Preventative health care 01/04/2012   ECZEMA 04/01/2009    Heather Roberts, PT, DPT02/08/239:30 AM   [facility] @ [facility] [place], [place], [address] Phone: [phone]   Fax:  [phone]  Name: [name] MRN: [id number] Date of Birth:  09/05/1970

## 2021-03-18 ENCOUNTER — Ambulatory Visit: Payer: 59

## 2021-03-18 ENCOUNTER — Other Ambulatory Visit: Payer: Self-pay

## 2021-03-18 DIAGNOSIS — M6281 Muscle weakness (generalized): Secondary | ICD-10-CM

## 2021-03-18 DIAGNOSIS — R293 Abnormal posture: Secondary | ICD-10-CM

## 2021-03-18 DIAGNOSIS — M62838 Other muscle spasm: Secondary | ICD-10-CM | POA: Diagnosis not present

## 2021-03-18 DIAGNOSIS — R279 Unspecified lack of coordination: Secondary | ICD-10-CM

## 2021-03-18 NOTE — Patient Instructions (Signed)
Access Code: [email or web address] URL: [email or web address] Date: 03/18/2021 Prepared by: Heather Roberts  Exercises Supine Diaphragmatic Breathing - 5 x daily - 7 x weekly - 1 sets - 10 reps Supine Lower Trunk Rotation - 1 x daily - 7 x weekly - 3 sets - 10 reps Supine Butterfly Groin Stretch - 1 x daily - 7 x weekly - 1 sets - 2 reps - 60 hold Child's Pose Stretch - 1 x daily - 7 x weekly - 1 sets - 2 reps - 60 hold Supine Pelvic Floor Contraction - 2 x daily - 7 x weekly - 1 sets - 15 reps Supine Transversus Abdominis Bracing with Leg Extension - 1 x daily - 7 x weekly - 2 sets - 10 reps Supine Bridge with Mini Swiss Ball Between Knees - 1 x daily - 7 x weekly - 2 sets - 10 reps Clamshell - 1 x daily - 7 x weekly - 2 sets - 10 reps Supine Shoulder Horizontal Abduction with Resistance - 1 x daily - 7 x weekly - 2 sets - 10 reps Prone Hip Internal Rotation AROM - 1 x daily - 7 x weekly - 2 sets - 10 reps

## 2021-03-18 NOTE — Therapy (Signed)
[facility] @ [address] [place] [place], [place], [address] Phone: [phone]   Fax:  [phone]  Physical Therapy Treatment  Patient Details  Name: [name] MRN: [id number] Date of Birth: 01-28-1971 Referring Provider (PT): Rexene Alberts MD   Encounter Date: 03/18/2021   PT End of Session - 03/18/21 0846     Visit Number 5    Date for PT Re-Evaluation 04/29/21    Authorization Type [employer]    PT Start Time 0845    PT Stop Time 0925    PT Time Calculation (min) 40 min    Activity Tolerance Patient tolerated treatment well    Behavior During Therapy [facility] for tasks assessed/performed             Past Medical History:  Diagnosis Date   Hyperlipidemia    past hx of borderline but recently controlled   Seborrhea 01/04/2012   Tinea pedis 01/04/2012    Past Surgical History:  Procedure Laterality Date   LYMPHADENECTOMY Bilateral 12/29/2020   Procedure: LYMPHADENECTOMY/ PELVIC;  Surgeon: Janith Lima, MD;  Location: [facility];  Service: Urology;  Laterality: Bilateral;   ROBOT ASSISTED LAPAROSCOPIC RADICAL PROSTATECTOMY N/A 12/29/2020   Procedure: XI ROBOTIC ASSISTED LAPAROSCOPIC RADICAL PROSTATECTOMY;  Surgeon: Janith Lima, MD;  Location: [facility];  Service: Urology;  Laterality: N/A;   TRANSURETHRAL RESECTION OF PROSTATE N/A 11/05/2019   Procedure: TRANSURETHRAL RESECTION OF THE PROSTATE (TURP);  Surgeon: Janith Lima, MD;  Location: [facility];  Service: Urology;  Laterality: N/A;   WISDOM TOOTH EXTRACTION      There were no vitals filed for this visit.   Subjective Assessment - 03/18/21 0846     Subjective Pt states that he feels godo today, but yesterday he had soreness in penis throughout the day. He believes that he woke up with it or it started after urination. He does report walking Monday, but cannot think that he did anything different through the walk. He does feel like he had a busy  Monday with more work and stress. He is working on getting to exercises more regularly. He states that he does pelvic floor exercises every day, but he has that same penile pain when doing them.    Patient Stated Goals get back to all activities without discomfort; play basketball with kids    Currently in Pain? No/denies                [facility] PT Assessment - 03/18/21 0001       AROM   Overall AROM Comments Lumbar: all A/ROM reduced by 25%                   No emotional/communication barriers or cognitive limitation. Patient is motivated to learn. Patient understands and agrees with treatment goals and plan. PT explains patient will be examined in standing, sitting, and lying down to see how their muscles and joints work. When they are ready, they will be asked to remove their underwear so PT can examine their perineum. The patient is also given the option of providing their own chaperone as one is not provided in our facility. The patient also has the right and is explained the right to defer or refuse any part of the evaluation or treatment including the internal exam. With the patient's consent, PT will use one gloved finger to gently assess the muscles of the pelvic floor, seeing how well it contracts and relaxes  and if there is muscle symmetry. After, the patient will get dressed and PT and patient will discuss exam findings and plan of care. PT and patient discuss plan of care, schedule, attendance policy and HEP activities.      Pelvic Floor Special Questions - 03/18/21 0001     Strength weak squeeze, no lift               [facility] Adult PT Treatment/Exercise - 03/18/21 0001       Lumbar Exercises: Stretches   Lower Trunk Rotation Limitations 3 x 10    Other Lumbar Stretch Exercise bent knee fall in 5 x 20 sec bil      Lumbar Exercises: Prone   Other Prone Lumbar Exercises windshield wipers 3 x 10      Manual Therapy   Manual Therapy Internal Pelvic Floor     Internal Pelvic Floor Intenral rectal manual release of bil scar tissue and deep pelvic floor                     PT Education - 03/18/21 0853     Education Details Pt education on need toreturn to internal rectal assessment to see what may be causing increase in penile pain.    Person(s) Educated Patient    Methods Explanation;Demonstration;Tactile cues;Verbal cues    Comprehension Verbalized understanding              PT Short Term Goals - 03/18/21 0855       PT SHORT TERM GOAL #1   Title Pt will be independent with initial HEP in 2 weeks.    Time 4    Period Weeks    Status Achieved    Target Date 03/18/21      PT SHORT TERM GOAL #2   Title Pt will demonstrate improved abdominal moiblity and diaphragmatic breathing in order to mobilize scar tissue and promote pelvic floor relaxation/mobility.    Time 4    Period Weeks    Status Partially Met    Target Date 03/18/21      PT SHORT TERM GOAL #3   Title Pt will incresae lumbar A/ROM in all directions by 25% in order to help establish better length tension relationship with lumbopelvic mobility.    Time 4    Period Weeks    Status Achieved    Target Date 03/18/21               PT Long Term Goals - 03/18/21 0855       PT LONG TERM GOAL #1   Title Pt will be independent with advanced HEP.    Time 10    Period Weeks    Status Partially Met    Target Date 04/29/21      PT LONG TERM GOAL #2   Title Pt will improve pelvic floor muscle tone and increase strength to 3/5 in order to decrease urinary incontinence to <1 episode a week.    Time 10    Period Weeks    Status Partially Met    Target Date 04/29/21      PT LONG TERM GOAL #3   Title Pt will report improvements in ability to achieve erection in order to participate in intimate activities with partner.    Time 10    Period Weeks    Status Partially Met    Target Date 04/29/21      PT LONG TERM GOAL #4   Title Pt will  demonstrate increase  in all impaired hip muscle strength by one muscle grade in order to help improve functoinal ability and energy with daily activities.    Time 10    Period Weeks    Status Unable to assess    Target Date 04/29/21                   Plan - 03/18/21 0853     Clinical Impression Statement Due to increase in penile pain, intenral rectal exam performed this treatment session. Pt education performed that there may be scar tissue/adhesions that are being pulled on with pelvic floor contraction, and releasing these adhesions will be important moving forward to make sure resting pain does not continue or progress. With internal exam, we did find significant bil scar tissue restriction that was painful; palpation and release techniques did reproduce penile pain that pt has during contractions and throughout the day yesterday. Some release noted with manual techniques and reduction in pain reported. He was instructed to decrease number of pelvic floorcontractions to 15x morning and night and focus more on movement than very strong squeeze. Mobility exercises progressed with good tolerance and HEP updated. He will conitnue to benefit from skilled PT intervention in order to progress pelvic floor mobility/decrease scar tissue and progress functional strengthening.    PT Next Visit Plan If penile pain persists, plan to continue internal manual techniques; if improved, progress functoinal strengthening exercises to tolerance.    PT Home Exercise Plan Access Code: [email or web address]    Consulted and Agree with Plan of Care Patient             Patient will benefit from skilled therapeutic intervention in order to improve the following deficits and impairments:  Decreased coordination, Decreased range of motion, Increased fascial restricitons, Impaired tone, Decreased endurance, Increased muscle spasms, Pain, Decreased activity tolerance, Decreased scar mobility, Hypomobility, Impaired flexibility, Improper body  mechanics, Decreased mobility, Decreased strength, Postural dysfunction  Visit Diagnosis: Other muscle spasm  Muscle weakness (generalized)  Unspecified lack of coordination  Abnormal posture     Problem List Patient Active Problem List   Diagnosis Date Noted   Prostate cancer ([place]) 12/29/2020   Malignant neoplasm of prostate ([place]) 10/20/2020   Rectal bleeding 12/18/2019   BPH (benign prostatic hyperplasia) 11/05/2019   Pure hypercholesterolemia 11/11/2014   Thumb pain 07/09/2013   Preventative health care 01/04/2012   ECZEMA 04/01/2009    Heather Roberts, PT, DPT02/15/239:27 AM   [facility] @ [address] [place], [place], [address] Phone: [phone]   Fax:  [phone]  Name: [name] MRN: [id number] Date of Birth: [birth year]

## 2021-03-25 ENCOUNTER — Ambulatory Visit: Payer: 59

## 2021-03-25 ENCOUNTER — Other Ambulatory Visit: Payer: Self-pay

## 2021-03-25 DIAGNOSIS — M6281 Muscle weakness (generalized): Secondary | ICD-10-CM

## 2021-03-25 DIAGNOSIS — R293 Abnormal posture: Secondary | ICD-10-CM | POA: Diagnosis not present

## 2021-03-25 DIAGNOSIS — M62838 Other muscle spasm: Secondary | ICD-10-CM | POA: Diagnosis not present

## 2021-03-25 DIAGNOSIS — R279 Unspecified lack of coordination: Secondary | ICD-10-CM | POA: Diagnosis not present

## 2021-03-25 NOTE — Therapy (Signed)
[facility] @ [address] [place], [place], [address] Phone: [phone]   Fax:  [phone]  Physical Therapy Treatment  Patient Details  Name: [name] MRN: [id number] Date of Birth: 1970/07/03 Referring Provider (PT): Rexene Alberts MD   Encounter Date: 03/25/2021   PT End of Session - 03/25/21 0850     Visit Number 6    Date for PT Re-Evaluation 04/29/21    Authorization Type [employer]    PT Start Time 0849    PT Stop Time 0927    PT Time Calculation (min) 38 min    Activity Tolerance Patient tolerated treatment well    Behavior During Therapy [facility] for tasks assessed/performed             Past Medical History:  Diagnosis Date   Hyperlipidemia    past hx of borderline but recently controlled   Seborrhea 01/04/2012   Tinea pedis 01/04/2012    Past Surgical History:  Procedure Laterality Date   LYMPHADENECTOMY Bilateral 12/29/2020   Procedure: LYMPHADENECTOMY/ PELVIC;  Surgeon: Janith Lima, MD;  Location: [facility];  Service: Urology;  Laterality: Bilateral;   ROBOT ASSISTED LAPAROSCOPIC RADICAL PROSTATECTOMY N/A 12/29/2020   Procedure: XI ROBOTIC ASSISTED LAPAROSCOPIC RADICAL PROSTATECTOMY;  Surgeon: Janith Lima, MD;  Location: [facility];  Service: Urology;  Laterality: N/A;   TRANSURETHRAL RESECTION OF PROSTATE N/A 11/05/2019   Procedure: TRANSURETHRAL RESECTION OF THE PROSTATE (TURP);  Surgeon: Janith Lima, MD;  Location: [facility];  Service: Urology;  Laterality: N/A;   WISDOM TOOTH EXTRACTION      There were no vitals filed for this visit.   Subjective Assessment - 03/25/21 0851     Subjective Pt states that he has not had any pain with contractions or in general since last week. He states that overall he is feeling more energetic and able to feel the contractions better. He still feels weak, but not as weak.    Patient Stated Goals get back to all activities without discomfort;  play basketball with kids    Currently in Pain? No/denies    Multiple Pain Sites No                               [facility] Adult PT Treatment/Exercise - 03/25/21 0001       Lumbar Exercises: Stretches   Active Hamstring Stretch Limitations 60 sec bil, seated    Piriformis Stretch Limitations 60 sec bil, seated      Lumbar Exercises: Standing   Functional Squats Limitations 2 x 10, to bed for posterior weight shift    Row Strengthening;Both;20 reps    Theraband Level (Row) Level 3 (Green)    Shoulder Extension Strengthening;20 reps;Theraband    Theraband Level (Shoulder Extension) Level 3 (Green)    Other Standing Lumbar Exercises Pallof press 10x bil, red    Other Standing Lumbar Exercises 3-way kick bil, 10x each; marching on airex 2 x 10      Lumbar Exercises: Supine   Dead Bug Limitations 2 x 10, cues for appropriate core facilitation    Bridge with [employer] Limitations 10x, good core facilitation      Lumbar Exercises: Quadruped   Opposite Arm/Leg Raise Limitations 2 x 10, cue for core/pelvic stability                     PT Education - 03/25/21  0905   ° ° Education Details Pt education performed on plan for next 3 visits and all exercise progressions from today. Discussed that one of his goals over the next week is to return to playing basketball, just 5 minutes.   ° Person(s) Educated Patient   ° Methods Explanation;Demonstration;Tactile cues;Verbal cues;Handout   ° Comprehension Verbalized understanding   ° °  °  ° °  ° ° ° PT Short Term Goals - 03/18/21 0855   ° °  ° PT SHORT TERM GOAL #1  ° Title Pt will be independent with initial HEP in 2 weeks.   ° Time 4   ° Period Weeks   ° Status Achieved   ° Target Date 03/18/21   °  ° PT SHORT TERM GOAL #2  ° Title Pt will demonstrate improved abdominal moiblity and diaphragmatic breathing in order to mobilize scar tissue and promote pelvic floor relaxation/mobility.   ° Time 4   ° Period Weeks   °  Status Partially Met   ° Target Date 03/18/21   °  ° PT SHORT TERM GOAL #3  ° Title Pt will incresae lumbar A/ROM in all directions by 25% in order to help establish better length tension relationship with lumbopelvic mobility.   ° Time 4   ° Period Weeks   ° Status Achieved   ° Target Date 03/18/21   ° °  °  ° °  ° ° ° ° PT Long Term Goals - 03/18/21 0855   ° °  ° PT LONG TERM GOAL #1  ° Title Pt will be independent with advanced HEP.   ° Time 10   ° Period Weeks   ° Status Partially Met   ° Target Date 04/29/21   °  ° PT LONG TERM GOAL #2  ° Title Pt will improve pelvic floor muscle tone and increase strength to 3/5 in order to decrease urinary incontinence to <1 episode a week.   ° Time 10   ° Period Weeks   ° Status Partially Met   ° Target Date 04/29/21   °  ° PT LONG TERM GOAL #3  ° Title Pt will report improvements in ability to achieve erection in order to participate in intimate activities with partner.   ° Time 10   ° Period Weeks   ° Status Partially Met   ° Target Date 04/29/21   °  ° PT LONG TERM GOAL #4  ° Title Pt will demonstrate increase in all impaired hip muscle strength by one muscle grade in order to help improve functoinal ability and energy with daily activities.   ° Time 10   ° Period Weeks   ° Status Unable to assess   ° Target Date 04/29/21   ° °  °  ° °  ° ° ° ° ° ° ° ° Plan - 03/25/21 0908   ° ° Clinical Impression Statement Pt is experiencing no pain with pelvic floor contractions, indicating that scar tissue restriction was most likely a large contributing factor. Believe that decresaing number/intensity of contractions has also been helpful without increasing urinary incontinence. Due to progress, no manual techniques performed this session with focus on exercise/stretch progressions. Pt toelrated all progressions well with no increase in pain and improving breath coordination with less frequent cuing. He will conitnue to benefit from skilled PT intervention in order to progress pelvic  floor mobility/decrease scar tissue and progress functional strengthening.   ° PT Treatment/Interventions ADLs/Self Care   Home Management;Cryotherapy;Electrical Stimulation;Biofeedback;Moist Heat;Therapeutic activities;Therapeutic exercise;Neuromuscular re-education;Manual techniques;Patient/family education;Scar mobilization;Passive range of motion;Dry needling;Spinal Manipulations   ° PT Next Visit Plan Continue to progress functional strengthening to patient tolerance; manual techniques if there is any increase in pain.   ° PT Home Exercise Plan Access Code: [email or web address]   ° Consulted and Agree with Plan of Care Patient   ° °  °  ° °  ° ° °Patient will benefit from skilled therapeutic intervention in order to improve the following deficits and impairments:  Decreased coordination, Decreased range of motion, Increased fascial restricitons, Impaired tone, Decreased endurance, Increased muscle spasms, Pain, Decreased activity tolerance, Decreased scar mobility, Hypomobility, Impaired flexibility, Improper body mechanics, Decreased mobility, Decreased strength, Postural dysfunction ° °Visit Diagnosis: °Other muscle spasm ° °Muscle weakness (generalized) ° °Unspecified lack of coordination ° °Abnormal posture ° ° ° ° °Problem List °Patient Active Problem List  ° Diagnosis Date Noted  ° Prostate cancer ([facility]) 12/29/2020  ° Malignant neoplasm of prostate ([facility]) 10/20/2020  ° Rectal bleeding 12/18/2019  ° BPH (benign prostatic hyperplasia) 11/05/2019  ° Pure hypercholesterolemia 11/11/2014  ° Thumb pain 07/09/2013  ° Preventative health care 01/04/2012  ° ECZEMA 04/01/2009  ° ° °Kristen Steer, PT, DPT02/22/239:29 AM ° ° °[facility] °[facility] [place] °[address] °[place], [place], [address] °Phone: [phone]   Fax:  [phone] ° °Name: [name] °MRN: [id number] °Date of Birth: 04/29/1970 ° ° ° °

## 2021-03-25 NOTE — Patient Instructions (Signed)
Access Code: [email or web address] URL: [email or web address] Date: 03/25/2021 Prepared by: Heather Roberts  Exercises Supine Diaphragmatic Breathing - 5 x daily - 7 x weekly - 1 sets - 10 reps Supine Lower Trunk Rotation - 1 x daily - 7 x weekly - 3 sets - 10 reps Supine Butterfly Groin Stretch - 1 x daily - 7 x weekly - 1 sets - 2 reps - 60 hold Child's Pose Stretch - 1 x daily - 7 x weekly - 1 sets - 2 reps - 60 hold Supine Pelvic Floor Contraction - 2 x daily - 7 x weekly - 1 sets - 15 reps Supine Transversus Abdominis Bracing with Leg Extension - 1 x daily - 7 x weekly - 2 sets - 10 reps Supine Bridge with Mini Swiss Ball Between Knees - 1 x daily - 7 x weekly - 2 sets - 10 reps Clamshell - 1 x daily - 7 x weekly - 2 sets - 10 reps Prone Hip Internal Rotation AROM - 1 x daily - 7 x weekly - 2 sets - 10 reps Supine Dead Bug with Leg Extension - 1 x daily - 7 x weekly - 2 sets - 10 reps Marching Bridge - 1 x daily - 7 x weekly - 2 sets - 10 reps Standing Shoulder Row with Anchored Resistance - 1 x daily - 7 x weekly - 2 sets - 10 reps Shoulder extension with resistance - Neutral - 1 x daily - 7 x weekly - 2 sets - 10 reps Squat with Chair Touch - 1 x daily - 7 x weekly - 2 sets - 10 reps

## 2021-04-01 ENCOUNTER — Other Ambulatory Visit: Payer: Self-pay

## 2021-04-01 ENCOUNTER — Ambulatory Visit: Payer: 59 | Attending: Urology

## 2021-04-01 DIAGNOSIS — R293 Abnormal posture: Secondary | ICD-10-CM | POA: Diagnosis not present

## 2021-04-01 DIAGNOSIS — M6281 Muscle weakness (generalized): Secondary | ICD-10-CM | POA: Insufficient documentation

## 2021-04-01 DIAGNOSIS — R279 Unspecified lack of coordination: Secondary | ICD-10-CM | POA: Insufficient documentation

## 2021-04-01 DIAGNOSIS — M62838 Other muscle spasm: Secondary | ICD-10-CM | POA: Insufficient documentation

## 2021-04-01 NOTE — Therapy (Signed)
[place] ?[facility] ?[place], [place], [address] ?Phone: [phone]   Fax:  [phone] ? ?Physical Therapy Treatment ? ?Patient Details  ?Name: [name] ?MRN: [id number] ?Date of Birth: 12/14/1970 ?Referring Provider (PT): Rexene Alberts MD ? ? ?Encounter Date: 04/01/2021 ? ? PT End of Session - 04/01/21 0846   ? ? Visit Number 7   ? Date for PT Re-Evaluation 04/29/21   ? Authorization Type Cign[employer]   ? PT Start Time [phone]   ? PT Stop Time 0926   ? PT Time Calculation (min) 39 min   ? Activity Tolerance Patient tolerated treatment well   ? Behavior During Therapy [facility] for tasks assessed/performed   ? ?  ?  ? ?  ? ? ?Past Medical History:  ?Diagnosis Date  ? Hyperlipidemia   ? past hx of borderline but recently controlled  ? Seborrhea 01/04/2012  ? Tinea pedis 01/04/2012  ? ? ?Past Surgical History:  ?Procedure Laterality Date  ? LYMPHADENECTOMY Bilateral 12/29/2020  ? Procedure: LYMPHADENECTOMY/ PELVIC;  Surgeon: Janith Lima, MD;  Location: [facility];  Service: Urology;  Laterality: Bilateral;  ? ROBOT ASSISTED LAPAROSCOPIC RADICAL PROSTATECTOMY N/A 12/29/2020  ? Procedure: XI ROBOTIC ASSISTED LAPAROSCOPIC RADICAL PROSTATECTOMY;  Surgeon: Janith Lima, MD;  Location: [facility];  Service: Urology;  Laterality: N/A;  ? TRANSURETHRAL RESECTION OF PROSTATE N/A 11/05/2019  ? Procedure: TRANSURETHRAL RESECTION OF THE PROSTATE (TURP);  Surgeon: Janith Lima, MD;  Location: [facility];  Service: Urology;  Laterality: N/A;  ? WISDOM TOOTH EXTRACTION    ? ? ?There were no vitals filed for this visit. ? ? Subjective Assessment - 04/01/21 0847   ? ? Subjective Pt states that he is not having any pain with contractions still, but he has had some minor penile pain over the last week. He reports that diaphragmatic breathing and pelvic floor awareness are very helpful at relaxing. He has tried playing basketball several times in the last week and states  that it felt great.   ? Patient Stated Goals get back to all activities without discomfort; play basketball with kids   ? Currently in Pain? No/denies   ? Multiple Pain Sites No   ? ?  ?  ? ?  ? ? ? ? ? ? ? ? ? ? ? ? ? ? ? ? ? ? ? ? [place] Adult PT Treatment/Exercise - 04/01/21 0001   ? ?  ? Lumbar Exercises: Stretches  ? Active Hamstring Stretch Limitations 60 sec bil, seated   ? Lower Trunk Rotation Limitations 3 x 10   ? Piriformis Stretch Limitations 60 sec bil, seated   ? Other Lumbar Stretch Exercise bent knee fall in 5 x 20 sec bil   ?  ? Lumbar Exercises: Standing  ? Functional Squats Limitations 2 x 10, to bed for posterior weight shift   ? Side Lunge 10 reps   stay in wide stance, no step together  ? Row Strengthening;Both;20 reps   ? Theraband Level (Row) Level 3 (Green)   ? Shoulder Extension Strengthening;20 reps;Theraband   ? Theraband Level (Shoulder Extension) Level 3 (Green)   ? Other Standing Lumbar Exercises Pallof press 10x bil, red; overhead press 10x bil 5 lbs   ? Other Standing Lumbar Exercises 3-way kick bil, 10x each; marching on airex 2 x 10   ? ?  ?  ? ?  ? ? ? ? ? ? ? ? ? ?  PT Education - 04/01/21 0858   ? ? Education Details Pt education performed on all exercise progressions.   ? Person(s) Educated Patient   ? Methods Explanation;Demonstration;Tactile cues;Verbal cues;Handout   ? Comprehension Verbalized understanding   ? ?  ?  ? ?  ? ? ? PT Short Term Goals - 03/18/21 0855   ? ?  ? PT SHORT TERM GOAL #1  ? Title Pt will be independent with initial HEP in 2 weeks.   ? Time 4   ? Period Weeks   ? Status Achieved   ? Target Date 03/18/21   ?  ? PT SHORT TERM GOAL #2  ? Title Pt will demonstrate improved abdominal moiblity and diaphragmatic breathing in order to mobilize scar tissue and promote pelvic floor relaxation/mobility.   ? Time 4   ? Period Weeks   ? Status Partially Met   ? Target Date 03/18/21   ?  ? PT SHORT TERM GOAL #3  ? Title Pt will incresae lumbar A/ROM in all directions by  25% in order to help establish better length tension relationship with lumbopelvic mobility.   ? Time 4   ? Period Weeks   ? Status Achieved   ? Target Date 03/18/21   ? ?  ?  ? ?  ? ? ? ? PT Long Term Goals - 03/18/21 0855   ? ?  ? PT LONG TERM GOAL #1  ? Title Pt will be independent with advanced HEP.   ? Time 10   ? Period Weeks   ? Status Partially Met   ? Target Date 04/29/21   ?  ? PT LONG TERM GOAL #2  ? Title Pt will improve pelvic floor muscle tone and increase strength to 3/5 in order to decrease urinary incontinence to <1 episode a week.   ? Time 10   ? Period Weeks   ? Status Partially Met   ? Target Date 04/29/21   ?  ? PT LONG TERM GOAL #3  ? Title Pt will report improvements in ability to achieve erection in order to participate in intimate activities with partner.   ? Time 10   ? Period Weeks   ? Status Partially Met   ? Target Date 04/29/21   ?  ? PT LONG TERM GOAL #4  ? Title Pt will demonstrate increase in all impaired hip muscle strength by one muscle grade in order to help improve functoinal ability and energy with daily activities.   ? Time 10   ? Period Weeks   ? Status Unable to assess   ? Target Date 04/29/21   ? ?  ?  ? ?  ? ? ? ? ? ? ? ? Plan - 04/01/21 0859   ? ? Clinical Impression Statement Pt doing very well overall demosntrated by minimal pain, return to playing basketball without issues, and progressing independence with HEP. We did review awareness of pelvic floor holding and diaphragmatic breathing when he does notice any increase in pain. He did well with stretch review and we discussed the importance of moiblity in addition to strengthening. Good tolerance to addition of 3-way kick on airex, overhead press, and side lunge to challenge functional strengthening, balance, and mobility; he demosntrated appropriate challenge and good form with all exercises with minor cues required. He will conitnue to benefit from skilled PT intervention in order to progress pelvic floor  mobility/decrease scar tissue and progress functional strengthening.   ? PT Treatment/Interventions ADLs/Self  Care Home Management;Cryotherapy;Electrical Stimulation;Biofeedback;Moist Heat;Therapeutic activities;Therapeutic exercise;Neuromuscular re-education;Manual techniques;Patient/family education;Scar mobilization;Passive range of motion;Dry needling;Spinal Manipulations   ? PT Next Visit Plan Continue to progress functional strengthening to patient tolerance; manual techniques if there is any increase in pain.   ? PT Home Exercise Plan Access Code: [email or web address]   ? Consulted and Agree with Plan of Care Patient   ? ?  ?  ? ?  ? ? ?Patient will benefit from skilled therapeutic intervention in order to improve the following deficits and impairments:  Decreased coordination, Decreased range of motion, Increased fascial restricitons, Impaired tone, Decreased endurance, Increased muscle spasms, Pain, Decreased activity tolerance, Decreased scar mobility, Hypomobility, Impaired flexibility, Improper body mechanics, Decreased mobility, Decreased strength, Postural dysfunction ? ?Visit Diagnosis: ?Other muscle spasm ? ?Muscle weakness (generalized) ? ?Unspecified lack of coordination ? ?Abnormal posture ? ? ? ? ?Problem List ?Patient Active Problem List  ? Diagnosis Date Noted  ? Prostate cancer ([place]) 12/29/2020  ? Malignant neoplasm of prostate ([place]) 10/20/2020  ? Rectal bleeding 12/18/2019  ? BPH (benign prostatic hyperplasia) 11/05/2019  ? Pure hypercholesterolemia 11/11/2014  ? Thumb pain 07/09/2013  ? Preventative health care 01/04/2012  ? ECZEMA 04/01/2009  ? ? ?Heather Roberts, PT, DPT03/01/239:28 AM ? ? ?[place] ?[facility] [place] ?[place], [place], [address] ?Phone: [phone]   Fax:  [phone] ? ?Name: JAYVIAN ESCOE ?MRN: [id number] ?Date of Birth: 1970-08-04 ? ? ? ?

## 2021-04-08 ENCOUNTER — Ambulatory Visit: Payer: 59

## 2021-04-08 ENCOUNTER — Other Ambulatory Visit: Payer: Self-pay

## 2021-04-08 DIAGNOSIS — M6281 Muscle weakness (generalized): Secondary | ICD-10-CM | POA: Diagnosis not present

## 2021-04-08 DIAGNOSIS — M62838 Other muscle spasm: Secondary | ICD-10-CM | POA: Diagnosis not present

## 2021-04-08 DIAGNOSIS — R279 Unspecified lack of coordination: Secondary | ICD-10-CM

## 2021-04-08 DIAGNOSIS — R293 Abnormal posture: Secondary | ICD-10-CM

## 2021-04-08 NOTE — Therapy (Signed)
[place] ?[facility] [place] [facility] ?[place], [place], [address] ?Phone: [phone]   Fax:  [phone] ? ?Physical Therapy Treatment ? ?Patient Details  ?Name: [name] ?MRN: [id number] ?Date of Birth: 1971/01/23 ?Referring Provider (PT): Rexene Alberts MD ? ? ?Encounter Date: 04/08/2021 ? ? PT End of Session - 04/08/21 0850   ? ? Visit Number 8   ? Date for PT Re-Evaluation 04/29/21   ? Authorization Type [employer]ign[employer]   ? PT Start Time [phone]   ? PT Stop Time 0926   ? PT Time Calculation (min) 39 min   ? Activity Tolerance Patient tolerated treatment well   ? Behavior During Therapy [facility] for tasks assessed/performed   ? ?  ?  ? ?  ? ? ?Past Medical History:  ?Diagnosis Date  ? Hyperlipidemia   ? past hx of borderline but recently controlled  ? Seborrhea 01/04/2012  ? Tinea pedis 01/04/2012  ? ? ?Past Surgical History:  ?Procedure Laterality Date  ? LYMPHADENECTOMY Bilateral 12/29/2020  ? Procedure: LYMPHADENECTOMY/ PELVIC;  Surgeon: Janith Lima, MD;  [facility];  Service: Urology;  Laterality: Bilateral;  ? ROBOT ASSISTED LAPAROSCOPIC RADICAL PROSTATECTOMY N/A 12/29/2020  ? Procedure: XI ROBOTIC ASSISTED LAPAROSCOPIC RADICAL PROSTATECTOMY;  Surgeon: Janith Lima, MD;  [facility];  Service: Urology;  Laterality: N/A;  ? TRANSURETHRAL RESECTION OF PROSTATE N/A 11/05/2019  ? Procedure: TRANSURETHRAL RESECTION OF THE PROSTATE (TURP);  Surgeon: Janith Lima, MD;  Location: [facility];  Service: Urology;  Laterality: N/A;  ? WISDOM TOOTH EXTRACTION    ? ? ?There were no vitals filed for this visit. ? ? Subjective Assessment - 04/08/21 0850   ? ? Subjective Pt states that he has had follow-up with doctor and MD is happy with progress. Pt's big concern at this point is that he has not had an erection; MD is prescribing medication, but did not recommend any other options at this point.   ? Patient Stated Goals get back to all activities without  discomfort; play basketball with kids   ? Currently in Pain? No/denies   ? Multiple Pain Sites No   ? ?  ?  ? ?  ? ? ? ? ? ? ? ? ? ? ? ? ? ? ? ? ? ? ? ? [place] Adult PT Treatment/Exercise - 04/08/21 0001   ? ?  ? Lumbar Exercises: Standing  ? Functional Squats 20 reps   10lb KB (not to bed for cuing)  ? Forward Lunge 10 reps   ? Side Lunge 10 reps   ? Row Strengthening;20 reps;Theraband   ? Theraband Level (Row) Level 3 (Green)   ? Row Limitations on swiss ball   ? Shoulder Extension Strengthening;20 reps;Theraband   ? Theraband Level (Shoulder Extension) Level 3 (Green)   ? Shoulder Extension Limitations on swiss ball   ? Other Standing Lumbar Exercises Pallof press on swiss ball 10x bil green ball; green swiss ball lift with core engagement 10x/breath coordination; overhead press 10x bil 6 lbs in wide stance (D2)   ?  ? Lumbar Exercises: Seated  ? Other Seated Lumbar Exercises Swiss ball exercises: circles 10x bil, lateral glide 10x bil, pelvic tilts 10x, knee rocking 2 x 10, marching 2 x 10, LAQ 2 x 10, heel raises 2 x 10   ? ?  ?  ? ?  ? ? ? ? ? ? ? ? ? ? PT Education -  04/08/21 0856   ? ? Education Details Pt education performed on other options to help with erection and that pelvic floor strength also plays a role in this. We will work on gethreing more informaito nto provide on penile pumps to provide at next session.   ? Person(s) Educated Patient   ? Methods Explanation;Demonstration;Tactile cues;Verbal cues   ? Comprehension Verbalized understanding   ? ?  ?  ? ?  ? ? ? PT Short Term Goals - 03/18/21 0855   ? ?  ? PT SHORT TERM GOAL #1  ? Title Pt will be independent with initial HEP in 2 weeks.   ? Time 4   ? Period Weeks   ? Status Achieved   ? Target Date 03/18/21   ?  ? PT SHORT TERM GOAL #2  ? Title Pt will demonstrate improved abdominal moiblity and diaphragmatic breathing in order to mobilize scar tissue and promote pelvic floor relaxation/mobility.   ? Time 4   ? Period Weeks   ? Status Partially Met    ? Target Date 03/18/21   ?  ? PT SHORT TERM GOAL #3  ? Title Pt will incresae lumbar A/ROM in all directions by 25% in order to help establish better length tension relationship with lumbopelvic mobility.   ? Time 4   ? Period Weeks   ? Status Achieved   ? Target Date 03/18/21   ? ?  ?  ? ?  ? ? ? ? PT Long Term Goals - 03/18/21 0855   ? ?  ? PT LONG TERM GOAL #1  ? Title Pt will be independent with advanced HEP.   ? Time 10   ? Period Weeks   ? Status Partially Met   ? Target Date 04/29/21   ?  ? PT LONG TERM GOAL #2  ? Title Pt will improve pelvic floor muscle tone and increase strength to 3/5 in order to decrease urinary incontinence to <1 episode a week.   ? Time 10   ? Period Weeks   ? Status Partially Met   ? Target Date 04/29/21   ?  ? PT LONG TERM GOAL #3  ? Title Pt will report improvements in ability to achieve erection in order to participate in intimate activities with partner.   ? Time 10   ? Period Weeks   ? Status Partially Met   ? Target Date 04/29/21   ?  ? PT LONG TERM GOAL #4  ? Title Pt will demonstrate increase in all impaired hip muscle strength by one muscle grade in order to help improve functoinal ability and energy with daily activities.   ? Time 10   ? Period Weeks   ? Status Unable to assess   ? Target Date 04/29/21   ? ?  ?  ? ?  ? ? ? ? ? ? ? ? Plan - 04/08/21 0857   ? ? Clinical Impression Statement Pt overall doing well demonstrated by steadily improving energy and strength with no increase in pelvic pain. We will work on gathering more information for him on penile pumps to help with erection and return to intercourse. He did very well with return to swiss ball activities to work on mobility and balance/core strength and demonstrates improved control and ROM with all activities compared to previous performance. Good toelrance to all strengthening progressions with clear improvements in endurance and stability, demonstrated by good balance even in new activities such as lunges. He  will conitnue to benefit from skilled PT intervention in order to progress pelvic floor mobility/decrease scar tissue and progress functional strengthening.   ? PT Treatment/Interventions ADLs/Self Care Home Management;Cryotherapy;Electrical Stimulation;Biofeedback;Moist Heat;Therapeutic activities;Therapeutic exercise;Neuromuscular re-education;Manual techniques;Patient/family education;Scar mobilization;Passive range of motion;Dry needling;Spinal Manipulations   ? PT Next Visit Plan Continue to progress functional strengthening to patient tolerance; manual techniques if there is any increase in pain.   ? PT Home Exercise Plan Access Code: [email or web address]   ? Consulted and Agree with Plan of Care Patient   ? ?  ?  ? ?  ? ? ?Patient will benefit from skilled therapeutic intervention in order to improve the following deficits and impairments:  Decreased coordination, Decreased range of motion, Increased fascial restricitons, Impaired tone, Decreased endurance, Increased muscle spasms, Pain, Decreased activity tolerance, Decreased scar mobility, Hypomobility, Impaired flexibility, Improper body mechanics, Decreased mobility, Decreased strength, Postural dysfunction ? ?Visit Diagnosis: ?Other muscle spasm ? ?Muscle weakness (generalized) ? ?Unspecified lack of coordination ? ?Abnormal posture ? ? ? ? ?Problem List ?Patient Active Problem List  ? Diagnosis Date Noted  ? Prostate cancer ([place]) 12/29/2020  ? Malignant neoplasm of prostate ([place]) 10/20/2020  ? Rectal bleeding 12/18/2019  ? BPH (benign prostatic hyperplasia) 11/05/2019  ? Pure hypercholesterolemia 11/11/2014  ? Thumb pain 07/09/2013  ? Preventative health care 01/04/2012  ? ECZEMA 04/01/2009  ? ? ?Heather Roberts, PT, DPT03/08/239:32 AM ? ? ?[place] ?[facility] [place] ?[place], [place], [address] ?Phone: [phone]   Fax:  [phone] ? ?Name: [name] ?MRN: [id number] ?Date of Birth: [birth year] ? ? ? ?

## 2021-04-15 ENCOUNTER — Ambulatory Visit: Payer: 59

## 2021-04-15 ENCOUNTER — Other Ambulatory Visit: Payer: Self-pay

## 2021-04-15 DIAGNOSIS — R293 Abnormal posture: Secondary | ICD-10-CM

## 2021-04-15 DIAGNOSIS — M6281 Muscle weakness (generalized): Secondary | ICD-10-CM | POA: Diagnosis not present

## 2021-04-15 DIAGNOSIS — M62838 Other muscle spasm: Secondary | ICD-10-CM | POA: Diagnosis not present

## 2021-04-15 DIAGNOSIS — R279 Unspecified lack of coordination: Secondary | ICD-10-CM

## 2021-04-15 NOTE — Therapy (Signed)
[place] ?[facility] [place] ?[place], [place], [address] ?Phone: [phone]   Fax:  [phone] ? ?Physical Therapy Treatment ? ?Patient Details  ?Name: [name] ?MRN: [id number] ?Date of Birth: 1970/12/01 ?Referring Provider (PT): Rexene Alberts MD ? ? ?Encounter Date: 04/15/2021 ? ? PT End of Session - 04/15/21 0916   ? ? Visit Number 9   ? Date for PT Re-Evaluation 04/29/21   ? Authorization Type [employer]   ? PT Start Time [phone]   ? PT Stop Time 0626   ? PT Time Calculation (min) 38 min   ? Activity Tolerance Patient tolerated treatment well   ? Behavior During Therapy [facility] for tasks assessed/performed   ? ?  ?  ? ?  ? ? ?Past Medical History:  ?Diagnosis Date  ? Hyperlipidemia   ? past hx of borderline but recently controlled  ? Seborrhea 01/04/2012  ? Tinea pedis 01/04/2012  ? ? ?Past Surgical History:  ?Procedure Laterality Date  ? LYMPHADENECTOMY Bilateral 12/29/2020  ? Procedure: LYMPHADENECTOMY/ PELVIC;  Surgeon: Janith Lima, MD;  [facility];  Service: Urology;  Laterality: Bilateral;  ? ROBOT ASSISTED LAPAROSCOPIC RADICAL PROSTATECTOMY N/A 12/29/2020  ? Procedure: XI ROBOTIC ASSISTED LAPAROSCOPIC RADICAL PROSTATECTOMY;  Surgeon: Janith Lima, MD;  [facility];  Service: Urology;  Laterality: N/A;  ? TRANSURETHRAL RESECTION OF PROSTATE N/A 11/05/2019  ? Procedure: TRANSURETHRAL RESECTION OF THE PROSTATE (TURP);  Surgeon: Janith Lima, MD;  Location: [facility];  Service: Urology;  Laterality: N/A;  ? WISDOM TOOTH EXTRACTION    ? ? ?There were no vitals filed for this visit. ? ? Subjective Assessment - 04/15/21 0847   ? ? Subjective Pt states that he is hesitant to stop PT because he has enjoyed getting moving again. He reports no issues at this time and feels like he has met all of his goals. He does not notice any pulling in lower abdomen when he lifts arms overhead or with exercises.   ? Currently in Pain? No/denies    ? Multiple Pain Sites No   ? ?  ?  ? ?  ? ? ? ? ? [facility] PT Assessment - 04/15/21 0001   ? ?  ? AROM  ? Overall AROM Comments All lumbar A/ROM WNL   ?  ? Strength  ? Overall Strength Comments All WNL bil   ? ?  ?  ? ?  ? ? ? ? ? ? ? ? ? ? ? ? ? ? ? ? [place] Adult PT Treatment/Exercise - 04/15/21 0001   ? ?  ? Self-Care  ? Self-Care Other Self-Care Comments   penile rehabilitation  ?  ? Lumbar Exercises: Standing  ? Functional Squats 20 reps   10lbs  ? Forward Lunge 20 reps   ? Side Lunge 20 reps   ? Other Standing Lumbar Exercises Chop, 2 x 10 bil, green band   ? Other Standing Lumbar Exercises 3-way kick bil, 10x each on airex; marching on airex 2 x 10; sumo squats 10lbs 2 x 10   ?  ? Lumbar Exercises: Supine  ? Bridge with clamshell 20 reps   ? Bridge with March 20 reps   ?  ? Lumbar Exercises: Quadruped  ? Opposite Arm/Leg Raise Limitations 2 x 10, cue for core/pelvic stability   ? Other Quadruped Lumbar Exercises Fire hydrant 2 x 10 bil   ? ?  ?  ? ?  ? ? ? ? ? ? ? ? ? ?  PT Education - 04/15/21 0911   ? ? Education Details Pt educatoin performed on penile pumps/rehabilitation; we discussed continuation of HEP moving forward; he was encouragedt ocall with any questions/concerns.   ? Person(s) Educated Patient   ? Methods Explanation;Demonstration;Tactile cues;Verbal cues;Handout   ? Comprehension Verbalized understanding   ? ?  ?  ? ?  ? ? ? PT Short Term Goals - 04/15/21 0901   ? ?  ? PT SHORT TERM GOAL #1  ? Title Pt will be independent with initial HEP in 2 weeks.   ? Time 4   ? Period Weeks   ? Status Achieved   ? Target Date 03/18/21   ?  ? PT SHORT TERM GOAL #2  ? Title Pt will demonstrate improved abdominal moiblity and diaphragmatic breathing in order to mobilize scar tissue and promote pelvic floor relaxation/mobility.   ? Time 4   ? Period Weeks   ? Status Achieved   ? Target Date 03/18/21   ?  ? PT SHORT TERM GOAL #3  ? Title Pt will incresae lumbar A/ROM in all directions by 25% in order to help  establish better length tension relationship with lumbopelvic mobility.   ? Time 4   ? Period Weeks   ? Status Achieved   ? Target Date 03/18/21   ? ?  ?  ? ?  ? ? ? ? PT Long Term Goals - 04/15/21 0901   ? ?  ? PT LONG TERM GOAL #1  ? Title Pt will be independent with advanced HEP.   ? Time 10   ? Period Weeks   ? Status Achieved   ? Target Date 04/29/21   ?  ? PT LONG TERM GOAL #2  ? Title Pt will improve pelvic floor muscle tone and increase strength to 3/5 in order to decrease urinary incontinence to <1 episode a week.   ? Time 10   ? Period Weeks   ? Status Achieved   ? Target Date 04/29/21   ?  ? PT LONG TERM GOAL #3  ? Title Pt will report improvements in ability to achieve erection in order to participate in intimate activities with partner.   ? Time 10   ? Period Weeks   ? Status Partially Met   ? Target Date 04/29/21   ?  ? PT LONG TERM GOAL #4  ? Title Pt will demonstrate increase in all impaired hip muscle strength by one muscle grade in order to help improve functoinal ability and energy with daily activities.   ? Time 10   ? Period Weeks   ? Status Achieved   ? Target Date 04/29/21   ? ?  ?  ? ?  ? ? ? ? ? ? ? ? Plan - 04/15/21 0901   ? ? Clinical Impression Statement Pt has done very well with pt intervention demonstrated by having met all goals besides ability to have and keep erection; however, we discussed penile pumps for rehabilitation to help make progress with erectile and sexual function (written protocol given and we looked at examples of pumps). He did very well with all previous progressions and addition of new functoinal strengthening/coordination exercises today. We discussed the importance of continuing to work on mobility part of HEP in addition to strengthening. Due to progress, having met most goals, and feeling confident in HEP, pt is prepared to D/C skilled PT intervention at this time; he was encouraged to call with any questions or  concerns.   ? PT Treatment/Interventions  ADLs/Self Care Home Management;Cryotherapy;Electrical Stimulation;Biofeedback;Moist Heat;Therapeutic activities;Therapeutic exercise;Neuromuscular re-education;Manual techniques;Patient/family education;Scar mobilization;Passive range of motion;Dry needling;Spinal Manipulations   ? PT Next Visit Plan D/C   ? PT Home Exercise Plan Access Code: [email or web address]   ? Consulted and Agree with Plan of Care Patient   ? ?  ?  ? ?  ? ? ?Patient will benefit from skilled therapeutic intervention in order to improve the following deficits and impairments:  Decreased coordination, Decreased range of motion, Increased fascial restricitons, Impaired tone, Decreased endurance, Increased muscle spasms, Pain, Decreased activity tolerance, Decreased scar mobility, Hypomobility, Impaired flexibility, Improper body mechanics, Decreased mobility, Decreased strength, Postural dysfunction ? ?Visit Diagnosis: ?Other muscle spasm ? ?Muscle weakness (generalized) ? ?Unspecified lack of coordination ? ?Abnormal posture ? ? ? ? ?Problem List ?Patient Active Problem List  ? Diagnosis Date Noted  ? Prostate cancer ([place]) 12/29/2020  ? Malignant neoplasm of prostate ([place]) 10/20/2020  ? Rectal bleeding 12/18/2019  ? BPH (benign prostatic hyperplasia) 11/05/2019  ? Pure hypercholesterolemia 11/11/2014  ? Thumb pain 07/09/2013  ? Preventative health care 01/04/2012  ? ECZEMA 04/01/2009  ? ? ?Jule Economy, PT ?04/15/2021, 9:34 AM ? ?[place] ?[facility] [place] ?[place], [place], [address] ?Phone: [phone]   Fax:  [phone] ? ?Name: [name] ?MRN: [id number] ?Date of Birth: 1970-07-15 ? ?PHYSICAL THERAPY DISCHARGE SUMMARY ? ?Visits from Start of Care: 9 ? ?Current functional level related to goals / functional outcomes: ?Independent ?  ?Remaining deficits: ?See above ?  ?Education / Equipment: ?HEP  ? ?Patient agrees to discharge. Patient goals were partially met. Patient is being discharged  due to  having met most of goals; he has the tools to continue making progress towards full completion. ? ?Heather Roberts, PT, DPT03/15/239:35 AM ? ? ? ?

## 2021-04-15 NOTE — Patient Instructions (Signed)
Penile Pump Protocol Prior to placing pump on penis, retract the foreskin and shave the pubic hair Place water based lubricant only on the ring.  Place pump over the penis to the base.  Use slow gentle compressions till erection Maintain erection for 5 seconds then release, So for 10-20 erections for 10 min per day for 4 weeks Then progress to  3 minutes on, 1 minute off, 3 repetitions of this sequence for 3 times per week up to 12 months For post-prostatectomy, use as early as 4 weeks post-operatively, up to 12 months  Device may be covered by insurance if billed for "penile rehabilitation"  The device is to stretch and increase strength of penis You Tube vides "Physiotherapy Penile Rehab for Erectile Dysfunction ( use of vacuum pump)  Types of devices Penile pumps  Vacurect Vacuum Therapy Erectile Dysfunction Device  by Vacurect Can get on Amazon  Active Erection System [email or web address]   

## 2021-05-26 ENCOUNTER — Encounter: Payer: Self-pay | Admitting: Family Medicine

## 2021-05-26 ENCOUNTER — Ambulatory Visit (INDEPENDENT_AMBULATORY_CARE_PROVIDER_SITE_OTHER): Payer: 59 | Admitting: Family Medicine

## 2021-05-26 VITALS — BP 126/74 | HR 72 | Wt 160.8 lb

## 2021-05-26 DIAGNOSIS — M79675 Pain in left toe(s): Secondary | ICD-10-CM

## 2021-05-26 MED ORDER — DICLOFENAC SODIUM 1 % EX GEL: 2.0000 g | Freq: Four times a day (QID) | CUTANEOUS | 0 refills | Status: DC

## 2021-05-26 NOTE — Progress Notes (Signed)
? ? ?  SUBJECTIVE:  ? ?CHIEF COMPLAINT / HPI:  ? ?Foot pain ?Patient presents for sudden onset foot pain.  Noticed that this morning and on his left foot.  Reports that he was walking and suddenly started having sharp pain at just below his first toe.  Denies any injury to the toe or foot.  Denies stepping on anything.  Has not felt this pain before.  Denies any warmth to the toe, swelling, erythema.  Just notes tenderness to palpation and although the tenderness is when he presses on the bottom he feels like the pain is in the top of his toe.  Patient has not tried any treatments for this pain at this time. ? ?OBJECTIVE:  ? ?BP 126/74   Pulse 72   Wt 160 lb 12.8 oz (72.9 kg)   SpO2 99%   BMI 25.95 kg/m?   ?General: Pleasant 51 year old male in no acute distress ?Cardiac: Regular rate and rhythm ?Respiratory: Work of breathing, speaking full sentences ?MSK: No swelling or erythema to the first metacarpal phalangeal joint on the left foot.  Tenderness to palpation on the plantar surface just proximal to the metacarpal phalangeal joint.  Full range of motion of the toe.  Pain with extension and flexion of the toe.  No erythema, edema, warmth ? ?ASSESSMENT/PLAN:  ? ?Great toe pain, left ?No signs of injury to the metacarpophalangeal joint.  No signs of infection such as erythema, edema, warmth.  There is mild tenderness.  Patient has not tried any over-the-counter medications.  Discussed NSAIDs as well as prescribed topical diclofenac.  Discussed strict return precautions.  Could be tendinopathy and may require referral to sports medicine for ultrasound and further evaluation.  Gout is also on the differential but less likely due to no history of gout and no erythema/edema of the joint.  Follow-up as needed. ?  ? ? ?Gifford Shave, MD ?[place]  ? ?

## 2021-05-26 NOTE — Patient Instructions (Signed)
It was wonderful seeing you today.  I am sorry you are having this foot pain.  I believe he may have strained a tendon in your foot near your big toe.  I want to treat this with NSAIDs such as ibuprofen.  You can take 400 mg every 8 hours.  I am also sending a prescription for a medication called Voltaren gel (diclofenac) which she can apply 4 times a day as well.  You can also use ice or heat to help with the pain.  If it does not improve in the next 3 to 4 days please be reevaluated.  If you notice increased warmth, swelling please be reevaluated.  If you have any questions or concerns call clinic.  I hope you have a wonderful day! ? ? ?

## 2021-05-27 DIAGNOSIS — M79675 Pain in left toe(s): Secondary | ICD-10-CM | POA: Insufficient documentation

## 2021-05-27 NOTE — Assessment & Plan Note (Addendum)
No signs of injury to the metacarpophalangeal joint.  No signs of infection such as erythema, edema, warmth.  There is mild tenderness.  Patient has not tried any over-the-counter medications.  Discussed NSAIDs as well as prescribed topical diclofenac.  Discussed strict return precautions.  Could be tendinopathy and may require referral to sports medicine for ultrasound and further evaluation.  Gout is also on the differential but less likely due to no history of gout and no erythema/edema of the joint.  Follow-up as needed. ?

## 2021-07-07 ENCOUNTER — Encounter: Payer: Self-pay | Admitting: *Deleted

## 2021-10-16 DIAGNOSIS — C61 Malignant neoplasm of prostate: Secondary | ICD-10-CM | POA: Diagnosis not present

## 2021-11-17 DIAGNOSIS — N5231 Erectile dysfunction following radical prostatectomy: Secondary | ICD-10-CM | POA: Diagnosis not present

## 2021-12-17 ENCOUNTER — Encounter: Payer: Self-pay | Admitting: Family Medicine

## 2021-12-17 ENCOUNTER — Ambulatory Visit (INDEPENDENT_AMBULATORY_CARE_PROVIDER_SITE_OTHER): Payer: BC Managed Care – PPO | Admitting: Family Medicine

## 2021-12-17 VITALS — BP 130/60 | HR 56 | Ht 66.0 in | Wt 144.6 lb

## 2021-12-17 DIAGNOSIS — R634 Abnormal weight loss: Secondary | ICD-10-CM

## 2021-12-17 DIAGNOSIS — Z0000 Encounter for general adult medical examination without abnormal findings: Secondary | ICD-10-CM

## 2021-12-17 DIAGNOSIS — E7800 Pure hypercholesterolemia, unspecified: Secondary | ICD-10-CM

## 2021-12-17 DIAGNOSIS — L989 Disorder of the skin and subcutaneous tissue, unspecified: Secondary | ICD-10-CM | POA: Diagnosis not present

## 2021-12-17 MED ORDER — SHINGRIX 50 MCG/0.5ML IM SUSR: INTRAMUSCULAR | 1 refills | Status: DC

## 2021-12-17 NOTE — Progress Notes (Signed)
SUBJECTIVE:   CHIEF COMPLAINT / HPI:   [name] is a 51 y.o. male with a past medical history of prostate cancer s/p prostatectomy 12/2020 and hypercholesterolemia presenting to the clinic for yearly checkup.  Weight loss Patient has noticed weight loss in the past 6 months, stating that 160 lbs is his baseline.  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  He also notes that he has been missing lunch sometimes due to being very busy at work.  Patient has a history of prostate cancer with radical prostatectomy performed 12/2020.  Most recent PSA screening last month was WNL.  Patient denies night sweats and unexplained fevers.  No rectal bleeding and stools are soft and regular.  He denies new lower back pain.  Health maintenance Patient is not interested in COVID-19 booster today. He is interested in the Shingles vaccine. Patient is up to date on preventative screening tests.  PERTINENT  PMH / PSH: No smoking history. 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  OBJECTIVE:   BP 130/60   Pulse (!) 56   Ht '5\' 6"'$  (1.676 m)   Wt 144 lb 9.6 oz (65.6 kg)   SpO2 99%   BMI 23.34 kg/m   General: Resting comfortably in chair. No acute distress, alert and at baseline. Cardiovascular: Regular rate and rhythm. Normal S1/S2. No murmurs, rubs, or gallops appreciated.  Pulmonary: Clear bilaterally to ascultation. No increased WOB, no accessory muscle usage. No wheezes, rales, or crackles. Abdominal: No tenderness to deep or light palpation. No rebound or guarding. No masses felt on palpation. Skin: Warm and dry. 0.5cm diameter circular pale white and flat skin lesion on left forearm with slight hyperpigmentation around edges and regular edges. Extremities: No peripheral edema bilaterally.  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  This weight loss is notable in the setting of the patient's recent prostate cancer diagnosis, but his PSA level continues to be WNL and he denies any other B symptoms.  Reassuring that patient has inciting factors explaining his weight loss at this time, but will continue to follow and requested patient return if weight loss continues for further workup. - Patient plans to eat some snacks at work more often. - Provided precautions to return if weight loss continues.  Pure hypercholesterolemia Previous LDL 145 on 12/18/2019.  ASCVD risk of 4.1-5.8% based on lipid panel from 2021, but needs to be recalculated with new labs. Will recheck today. - f/u lipid panel and CMP  Skin lesion of left arm Skin lesion has been present for several years and is not changing significantly (though reportedly occasionally swells up slightly), has regular borders, color, and symmetry.  Not concerning for melanoma at this time, but further evaluation by dermatology is reasonable. - Referral to dermatology per patient preference  Health maintenance - STD screening: Not interested in screening at this time. - Immunizations: Declines COVID-19 booster. Will get Shingrix at pharmacy soon, counseled patient on risks and benefits. - Lipid screening: CMP and lipid panel obtained today -  Colonoscopy: Last done 09/26/2020, next due in 2029  Follow up in 1 year for routine maintenance or sooner if needed.  Dimitry Mining engineer, [place]   Patient seen along with medical student Dimitry Shitarev. I personally evaluated this patient along with the student, and verified all aspects of the history, physical exam, and  medical decision making as documented by the student. I agree with the student's documentation and have made all necessary edits.  Chrisandra Netters, MD  [place]

## 2021-12-17 NOTE — Assessment & Plan Note (Signed)
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  This weight loss is notable in the setting of the patient's recent prostate cancer diagnosis, but his PSA level continues to be WNL and he denies any other B symptoms.  Reassuring that patient has inciting factors explaining his weight loss at this time, but will continue to follow and requested patient return if weight loss continues for further workup. - Patient plans to eat some snacks at work more often. - Provided precautions to return if weight loss continues.

## 2021-12-17 NOTE — Patient Instructions (Addendum)
It was great to see you today! Thank you for choosing [facility] for your primary care.  Today we addressed: Weight loss: It looks like you have lost about 16 lbs since April of last year, but we think that is due to your increased exercise at work and smaller lunchtime meals. It is reassuring that you have had lifestyle changes that explain the weight loss. IF you continue losing weight, please come back to see [place], as that could be a sign of something more serious. Spot on your left arm: Does not look concerning based on what we see in the clinic at this time, but we are placing a dermatology referral for your peace of mind and to look into it further. Shingles vaccine: You can get it at the pharmacy any time you are ready! It may make you feel bad for a day after, so please plan ahead in case you have anything important coming up.  We are checking some labs today, including your cholesterol, kidney function, and liver function.  You should return to our clinic in 1 year or sooner if there is anything we can help you with.  Thank you for coming to see [place] at [place] and for the opportunity to care for you! Koki Buxton, Medical Student Dr. Chrisandra Netters 12/17/2021, 8:58 AM  ____________________________________________________  Make sure to check out at the front desk before you leave today.  Please arrive at least 15 minutes prior to your scheduled appointments.  If you haven't already, please set up MyChart to have easy access to your labs results, and communication with your primary care physician.  If you had blood work today, you will get a MyChart message or a letter if results are normal. Otherwise, you will get a call from [place].  If you had a referral placed, they will call you to set up an appointment. Please give [place] a call if you don't hear back in the next 2 weeks.  If you need additional refills before your next appointment, please call your  pharmacy first.

## 2021-12-17 NOTE — Assessment & Plan Note (Signed)
Skin lesion has been present for several years and is not changing significantly (though reportedly occasionally swells up slightly), has regular borders, color, and symmetry.  Not concerning for melanoma at this time, but further evaluation by dermatology is reasonable. - Referral to dermatology per patient preference

## 2021-12-17 NOTE — Assessment & Plan Note (Addendum)
Previous LDL 145 on 12/18/2019.  ASCVD risk of 4.1-5.8% based on lipid panel from 2021, but needs to be recalculated with new labs. Will recheck today. - f/u lipid panel and CMP

## 2021-12-18 LAB — CMP14+EGFR
ALT: 14 IU/L (ref 0–44)
AST: 24 IU/L (ref 0–40)
Albumin/Globulin Ratio: 2 (ref 1.2–2.2)
Albumin: 4.8 g/dL (ref 3.8–4.9)
Alkaline Phosphatase: 66 IU/L (ref 44–121)
BUN/Creatinine Ratio: 17 (ref 9–20)
BUN: 15 mg/dL (ref 6–24)
Bilirubin Total: 0.7 mg/dL (ref 0.0–1.2)
CO2: 23 mmol/L (ref 20–29)
Calcium: 9.8 mg/dL (ref 8.7–10.2)
Chloride: 103 mmol/L (ref 96–106)
Creatinine, Ser: 0.89 mg/dL (ref 0.76–1.27)
Globulin, Total: 2.4 g/dL (ref 1.5–4.5)
Glucose: 86 mg/dL (ref 70–99)
Potassium: 3.7 mmol/L (ref 3.5–5.2)
Sodium: 141 mmol/L (ref 134–144)
Total Protein: 7.2 g/dL (ref 6.0–8.5)
eGFR: 104 mL/min/{1.73_m2} (ref >59)

## 2021-12-18 LAB — LIPID PANEL
Chol/HDL Ratio: 3.7 ratio (ref 0.0–5.0)
Cholesterol, Total: 204 mg/dL — ABNORMAL HIGH (ref 100–199)
HDL: 55 mg/dL (ref >39)
LDL Chol Calc (NIH): 139 mg/dL — ABNORMAL HIGH (ref 0–99)
Triglycerides: 53 mg/dL (ref 0–149)
VLDL Cholesterol Cal: 10 mg/dL (ref 5–40)

## 2022-01-13 DIAGNOSIS — N5231 Erectile dysfunction following radical prostatectomy: Secondary | ICD-10-CM | POA: Diagnosis not present

## 2022-02-16 IMAGING — CT CT RENAL STONE PROTOCOL
2 of 4 series · 16 of 46 positions shown, 18 images · non-contrast
Comparison: None.

CLINICAL DATA: Abdominal pain, dysuria

EXAM:
CT ABDOMEN AND PELVIS WITHOUT CONTRAST
TECHNIQUE: Multidetector CT imaging of the abdomen and pelvis was performed
following the standard protocol without IV contrast.

[Series 3: stone study 5.0 i30f 2 · axial · 0.81mm/px · z∈[+852,+1262]mm · 13 of 90 slices shown, 15 images]
[im 4/90  soft-tissue]
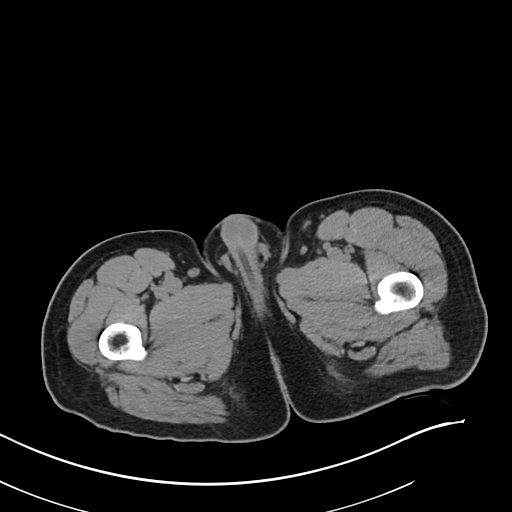
[im 4/90  bone]
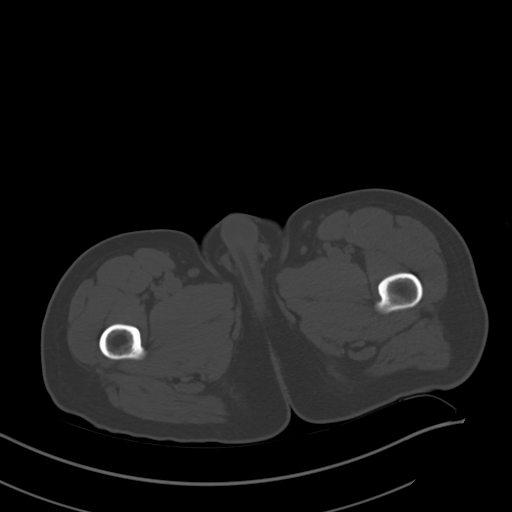
[im 12/90  soft-tissue]
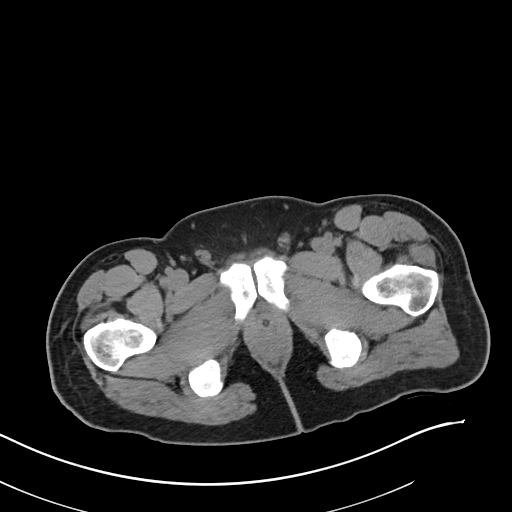
[im 20/90  soft-tissue]
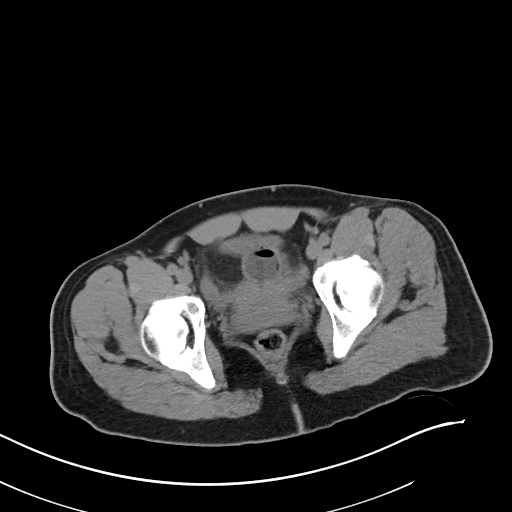
[im 24/90  soft-tissue]
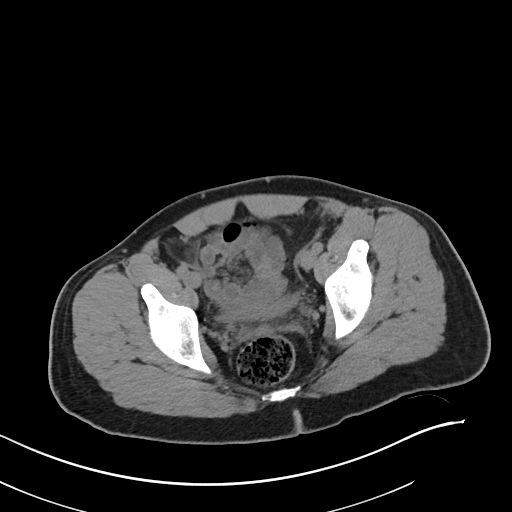
[im 31/90  soft-tissue]
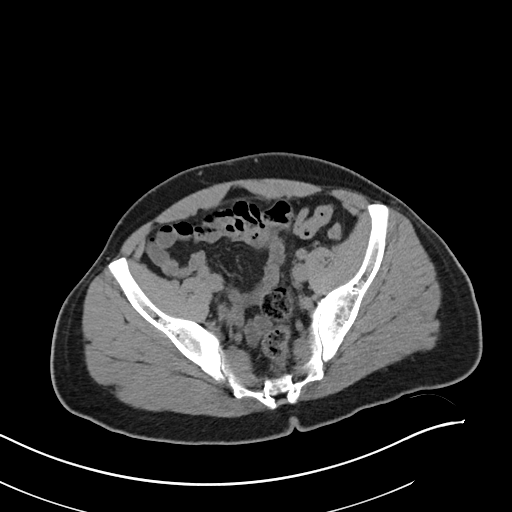
[im 39/90  soft-tissue]
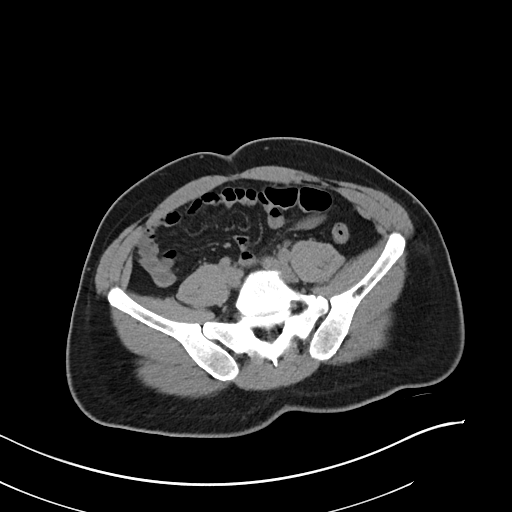
[im 47/90  soft-tissue]
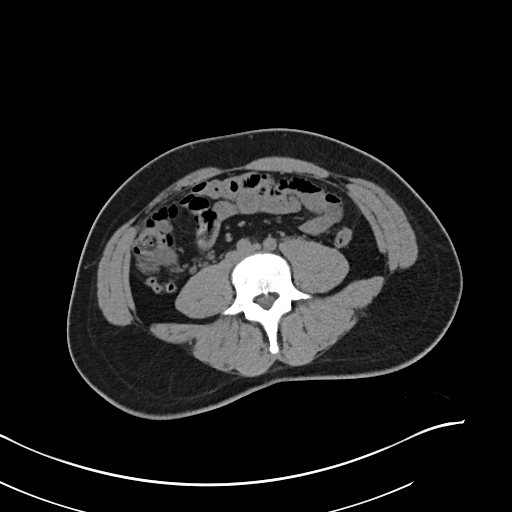
[im 51/90  soft-tissue]
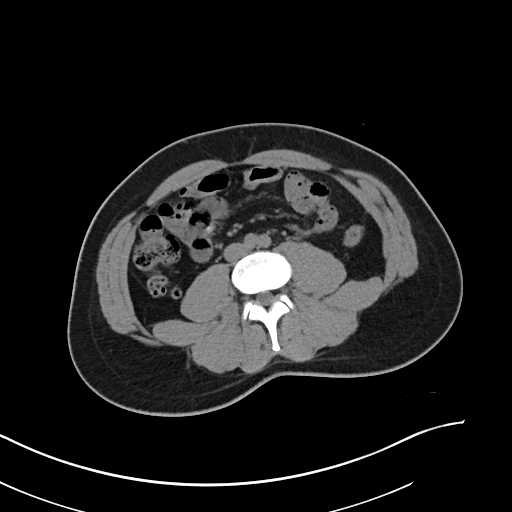
[im 59/90  soft-tissue]
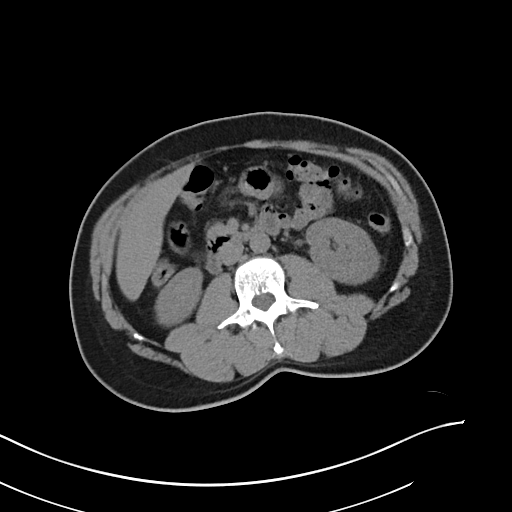
[im 59/90  bone]
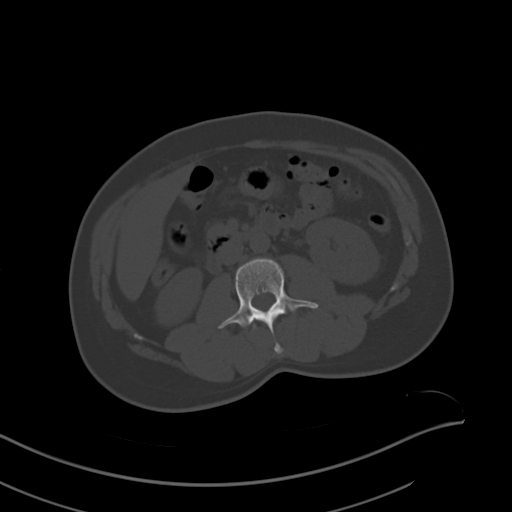
[im 66/90  soft-tissue]
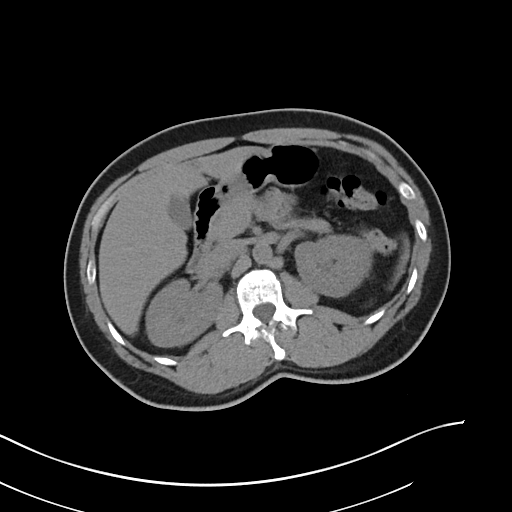
[im 70/90  soft-tissue]
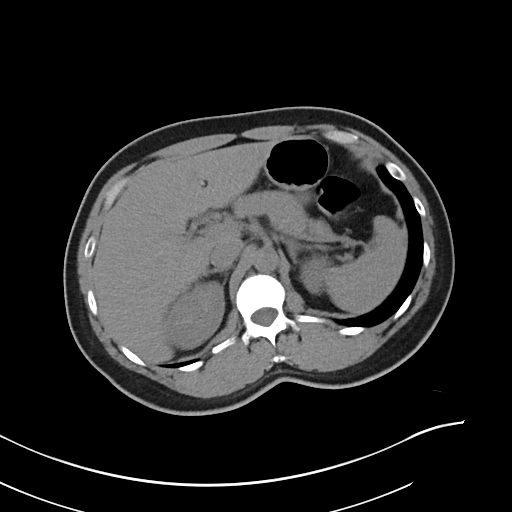
[im 78/90  soft-tissue]
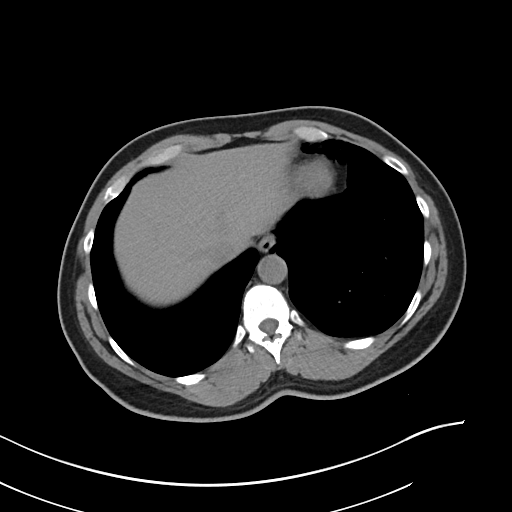
[im 86/90  soft-tissue]
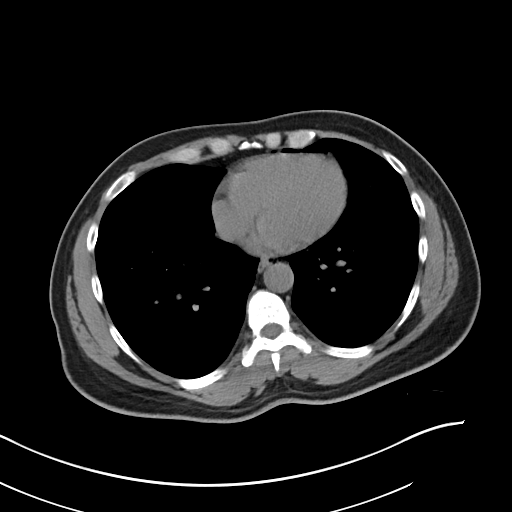

[Series 6: coronal soft tissue · coronal · 0.80mm/px · 3 of 98 slices shown]
[im 33/98  soft-tissue]
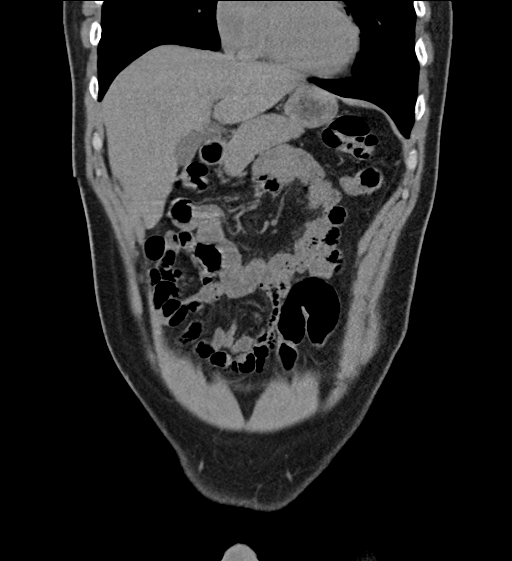
[im 44/98  soft-tissue]
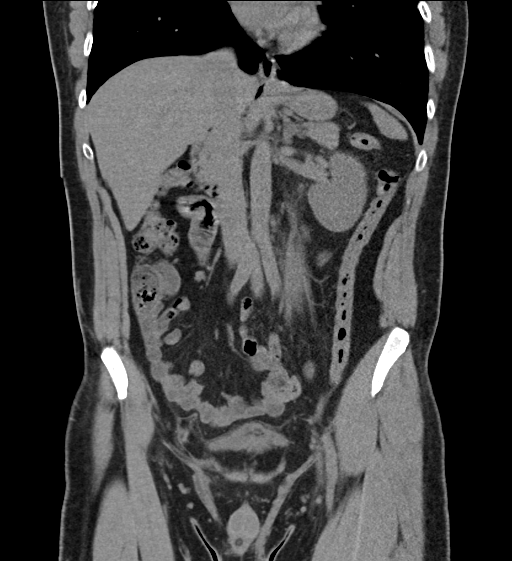
[im 54/98  soft-tissue]
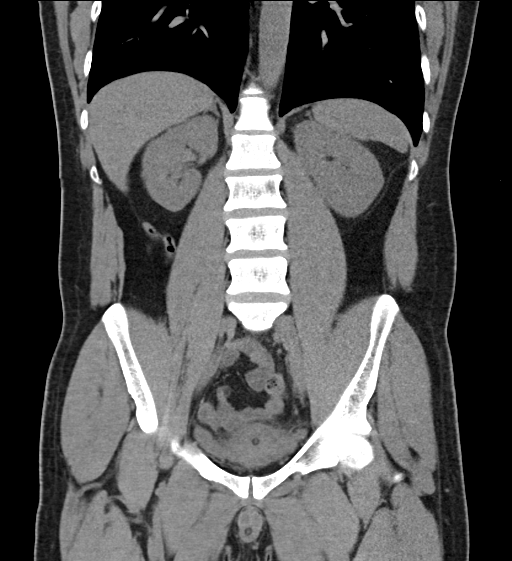

[16 of 46 positions shown; findings below may reference images not displayed]

FINDINGS: Lower chest: No acute pleural or parenchymal lung disease.

Hepatobiliary: No focal liver abnormality is seen. No gallstones,
gallbladder wall thickening, or biliary dilatation.

Pancreas: Unremarkable. No pancreatic ductal dilatation or
surrounding inflammatory changes.

Spleen: Normal in size without focal abnormality.

Adrenals/Urinary Tract: Bladder is decompressed with a Foley
catheter. There is a punctate calcification along the left aspect of
the Foley catheter in the region of the distal prosthetic urethra,
reference image 78/3. Urethral calculus cannot be excluded.

No urinary tract calculi or obstructive uropathy within either
kidney. The adrenals are normal.

Stomach/Bowel: No bowel obstruction or ileus. No wall thickening or
inflammatory change. Normal gas-filled appendix right lower
quadrant.

Vascular/Lymphatic: No significant vascular findings are present. No
enlarged abdominal or pelvic lymph nodes.

Reproductive: Prostate is mildly enlarged measuring 4.4 x 5.5 by
cm.

Other: No free fluid or free gas.  No abdominal wall hernia.

Musculoskeletal: No acute or destructive bony lesions. Reconstructed
images demonstrate no additional findings.
IMPRESSION: 1. Nonspecific punctate less than 2 mm calcification along the left
aspect of the Foley catheter in the distal prosthetic urethra.
Urethral calculus cannot be excluded.
2. No other urinary tract calculi or obstructive uropathy.
3. Enlarged prostate.

## 2022-03-18 DIAGNOSIS — C61 Malignant neoplasm of prostate: Secondary | ICD-10-CM | POA: Diagnosis not present

## 2022-03-24 DIAGNOSIS — J029 Acute pharyngitis, unspecified: Secondary | ICD-10-CM | POA: Diagnosis not present

## 2022-03-24 DIAGNOSIS — R6889 Other general symptoms and signs: Secondary | ICD-10-CM | POA: Diagnosis not present

## 2022-03-24 DIAGNOSIS — R0981 Nasal congestion: Secondary | ICD-10-CM | POA: Diagnosis not present

## 2022-03-26 ENCOUNTER — Telehealth: Payer: Self-pay

## 2022-03-26 NOTE — Telephone Encounter (Signed)
Patient calls nurse line reporting positive covid test.   He reports his symptoms started on Tuesday with chills, sweats, body aches, headache and congestion. He reports he took a home test on Wednesday and was positive.   He denies any SOB, cough, nausea, vomiting or diarrhea.   He reports he just wanted to make sure he was doing all the right things.   He reports he is feeling better, just "a little tired."   Conservative measures given to patient. OTC decongestants and warm fluids with honey.   Patient to call for clinic visit next week if symptoms worsen.   ED precautions given.

## 2022-06-09 DIAGNOSIS — C61 Malignant neoplasm of prostate: Secondary | ICD-10-CM | POA: Diagnosis not present

## 2022-06-09 DIAGNOSIS — N5231 Erectile dysfunction following radical prostatectomy: Secondary | ICD-10-CM | POA: Diagnosis not present

## 2022-10-21 DIAGNOSIS — C61 Malignant neoplasm of prostate: Secondary | ICD-10-CM | POA: Diagnosis not present

## 2022-10-21 DIAGNOSIS — N5231 Erectile dysfunction following radical prostatectomy: Secondary | ICD-10-CM | POA: Diagnosis not present

## 2022-12-02 DIAGNOSIS — C61 Malignant neoplasm of prostate: Secondary | ICD-10-CM | POA: Diagnosis not present

## 2022-12-22 ENCOUNTER — Encounter: Payer: Self-pay | Admitting: Family Medicine

## 2022-12-22 ENCOUNTER — Ambulatory Visit: Payer: BC Managed Care – PPO | Admitting: Family Medicine

## 2022-12-22 ENCOUNTER — Ambulatory Visit
Admission: RE | Admit: 2022-12-22 | Discharge: 2022-12-22 | Disposition: A | Discharge status: Home / Self Care | Payer: BC Managed Care – PPO | Source: Ambulatory Visit | Attending: Family Medicine

## 2022-12-22 VITALS — BP 106/62 | HR 68 | Ht 66.0 in | Wt 137.6 lb

## 2022-12-22 DIAGNOSIS — Z23 Encounter for immunization: Secondary | ICD-10-CM | POA: Diagnosis not present

## 2022-12-22 DIAGNOSIS — M79644 Pain in right finger(s): Secondary | ICD-10-CM

## 2022-12-22 DIAGNOSIS — M19041 Primary osteoarthritis, right hand: Secondary | ICD-10-CM | POA: Diagnosis not present

## 2022-12-22 NOTE — Patient Instructions (Addendum)
It was wonderful to see you today! Thank you for choosing [facility].   Please bring ALL of your medications with you to every visit.   Today we talked about:  Please have the x-ray done of your right hand. You can walk in to have it done at [facility] at [employer] [address] at [employer]. I will review the results with you when they are available. Please buddy tape your middle finger to your ring finger. I would recommend ice (2-3 time per day if needed) and Ibuprofen for pain relief.  Please follow up in 1 month if your symptoms have not improved  Call the clinic at [phone] if your symptoms worsen or you have any concerns.  Please be sure to schedule follow up at the front desk before you leave today.   Elberta Fortis, DO Family Medicine

## 2022-12-22 NOTE — Progress Notes (Signed)
    SUBJECTIVE:   CHIEF COMPLAINT / HPI:   Right middle finger pain Reports on Saturday a pile of heavy books fell near the tip of his finger and bent it backwards.  He noticed immediate pain and swelling after that time.  Does a lot of lifting at work.  Notes the swelling may have gotten worse but pain is improved.  PERTINENT  PMH / PSH: Prostate cancer, eczema  OBJECTIVE:   BP 106/62   Pulse 68   Ht 5\' 6"  (1.676 m)   Wt 137 lb 9.6 oz (62.4 kg)   SpO2 99%   BMI 22.21 kg/m    General: NAD, pleasant, able to participate in exam MSK: Edema of entire third digit on right hand noted.  No surrounding erythema or warmth noted.  Full range of motion.  Mild decrease in grip strength due to pain with making a fist.  Sensation intact distally.  Good cap refill distally.  Wrist with good range of motion and without pain.  ASSESSMENT/PLAN:   Assessment & Plan Pain of right middle finger Secondary to traumatic injury, mild decrease in grip strength due to pain with flexion of digit.  Will obtain x-ray to rule out occult fracture but most likely contusion. -Right hand x-ray -Supportive care with ice, ibuprofen and buddy taping.  Provided work note to reduce heavy lifting. Encounter for immunization Patient received his flu shot today.   Dr. Elberta Fortis, DO [facility]

## 2023-01-13 ENCOUNTER — Encounter: Payer: BC Managed Care – PPO | Admitting: Family Medicine

## 2023-01-14 DIAGNOSIS — N5231 Erectile dysfunction following radical prostatectomy: Secondary | ICD-10-CM | POA: Diagnosis not present

## 2023-01-18 ENCOUNTER — Ambulatory Visit: Payer: BC Managed Care – PPO | Admitting: Family Medicine

## 2023-01-18 ENCOUNTER — Encounter: Payer: Self-pay | Admitting: Family Medicine

## 2023-01-18 VITALS — BP 106/49 | HR 62 | Resp 20 | Ht 66.0 in | Wt 138.2 lb

## 2023-01-18 DIAGNOSIS — E7800 Pure hypercholesterolemia, unspecified: Secondary | ICD-10-CM

## 2023-01-18 DIAGNOSIS — M79644 Pain in right finger(s): Secondary | ICD-10-CM

## 2023-01-18 DIAGNOSIS — C61 Malignant neoplasm of prostate: Secondary | ICD-10-CM | POA: Diagnosis not present

## 2023-01-18 DIAGNOSIS — Z0000 Encounter for general adult medical examination without abnormal findings: Secondary | ICD-10-CM

## 2023-01-18 NOTE — Progress Notes (Unsigned)
  Date of Visit: 01/18/2023   SUBJECTIVE:   HPI:  [name] presents today for a well adult male exam.   Concerns today: Pain in right middle finger, see below STD Screening: Declines today Exercise: Active in his job No concerning substance use Mood: no concerns  Pain in finger: Has ongoing pain in his right middle finger.  He was seen here on 11/20 for pain in this finger and underwent an x-ray which showed osteoarthritis.  He has tried supportive care and notes the pain is worsening.  He is amenable to seeing a hand specialist and discussing options for further pain control.  OBJECTIVE:   BP (!) 106/49 (BP Location: Left Arm, Patient Position: Sitting, Cuff Size: Normal)   Pulse 62   Resp 20   Ht 5\' 6"  (1.676 m)   Wt 138 lb 3.2 oz (62.7 kg)   SpO2 100%   BMI 22.31 kg/m  Gen: NAD, pleasant, cooperative HEENT: NCAT, PERRL, no palpable thyromegaly or anterior cervical lymphadenopathy Heart: RRR, no murmurs Lungs: CTAB, NWOB Abdomen: soft, nontender to palpation Neuro: grossly nonfocal, speech normal Extremity: R middle finger with some swelling around PIP joint, limited flexion due to pain  ASSESSMENT/PLAN:    Assessment & Plan Routine adult health maintenance -STD screening: declines today -immunizations: Flu: UTD Tdap: UTD Shingrix: discussed getting at pharmacy COVID: declined -lipid screening: update lipids & BMP today -colon cancer screening: UTD -handout given on health maintenance topics Pain of right middle finger Persistent discomfort in setting of osteoarthritis demonstrated on x-ray.  Refer to hand surgery for discussion of options, query whether would benefit from steroid injection into the joint. Prostate cancer [facility]) Completed treatment, continues to follow regularly with urology for PSA monitoring.  FOLLOW UP: Follow up in 1 year for next CPE Referring to hand surgery  [place] J. Pollie Meyer, MD [facility]

## 2023-01-18 NOTE — Patient Instructions (Addendum)
It was great to see you again today.  Referring to hand specialist for your hand  Checking kidney function and cholesterol today Get shingles vaccine at your pharmacy  Follow up in 1 year, sooner if needed  Be well, Dr. Pollie Meyer

## 2023-01-19 LAB — LIPID PANEL
Chol/HDL Ratio: 3.2 {ratio} (ref 0.0–5.0)
Cholesterol, Total: 202 mg/dL — ABNORMAL HIGH (ref 100–199)
HDL: 64 mg/dL (ref >39)
LDL Chol Calc (NIH): 123 mg/dL — ABNORMAL HIGH (ref 0–99)
Triglycerides: 83 mg/dL (ref 0–149)
VLDL Cholesterol Cal: 15 mg/dL (ref 5–40)

## 2023-01-19 LAB — BASIC METABOLIC PANEL
BUN/Creatinine Ratio: 21 — ABNORMAL HIGH (ref 9–20)
BUN: 18 mg/dL (ref 6–24)
CO2: 21 mmol/L (ref 20–29)
Calcium: 9.8 mg/dL (ref 8.7–10.2)
Chloride: 105 mmol/L (ref 96–106)
Creatinine, Ser: 0.87 mg/dL (ref 0.76–1.27)
Glucose: 93 mg/dL (ref 70–99)
Potassium: 4.2 mmol/L (ref 3.5–5.2)
Sodium: 142 mmol/L (ref 134–144)
eGFR: 104 mL/min/{1.73_m2} (ref >59)

## 2023-01-20 DIAGNOSIS — Z0000 Encounter for general adult medical examination without abnormal findings: Secondary | ICD-10-CM | POA: Insufficient documentation

## 2023-01-20 NOTE — Assessment & Plan Note (Signed)
Completed treatment, continues to follow regularly with urology for PSA monitoring.

## 2023-01-20 NOTE — Assessment & Plan Note (Signed)
-  STD screening: declines today -immunizations: Flu: UTD Tdap: UTD Shingrix: discussed getting at pharmacy COVID: declined -lipid screening: update lipids & BMP today -colon cancer screening: UTD -handout given on health maintenance topics

## 2023-02-24 DIAGNOSIS — C61 Malignant neoplasm of prostate: Secondary | ICD-10-CM | POA: Diagnosis not present

## 2023-02-24 DIAGNOSIS — N5231 Erectile dysfunction following radical prostatectomy: Secondary | ICD-10-CM | POA: Diagnosis not present

## 2023-03-08 DIAGNOSIS — J069 Acute upper respiratory infection, unspecified: Secondary | ICD-10-CM | POA: Diagnosis not present

## 2023-03-08 DIAGNOSIS — Z20822 Contact with and (suspected) exposure to covid-19: Secondary | ICD-10-CM | POA: Diagnosis not present

## 2023-03-08 DIAGNOSIS — R509 Fever, unspecified: Secondary | ICD-10-CM | POA: Diagnosis not present

## 2023-04-13 DIAGNOSIS — M79644 Pain in right finger(s): Secondary | ICD-10-CM | POA: Diagnosis not present

## 2023-08-17 DIAGNOSIS — C61 Malignant neoplasm of prostate: Secondary | ICD-10-CM | POA: Diagnosis not present

## 2023-08-24 DIAGNOSIS — N5231 Erectile dysfunction following radical prostatectomy: Secondary | ICD-10-CM | POA: Diagnosis not present

## 2023-08-24 DIAGNOSIS — C61 Malignant neoplasm of prostate: Secondary | ICD-10-CM | POA: Diagnosis not present

## 2024-02-07 ENCOUNTER — Encounter: Admitting: Family Medicine

## 2024-02-23 ENCOUNTER — Ambulatory Visit: Admitting: Family Medicine

## 2024-02-23 VITALS — BP 116/72 | HR 66 | Ht 66.0 in | Wt 144.6 lb

## 2024-02-23 DIAGNOSIS — Z0000 Encounter for general adult medical examination without abnormal findings: Secondary | ICD-10-CM

## 2024-02-23 DIAGNOSIS — E7800 Pure hypercholesterolemia, unspecified: Secondary | ICD-10-CM

## 2024-02-23 DIAGNOSIS — Z0184 Encounter for antibody response examination: Secondary | ICD-10-CM

## 2024-02-23 NOTE — Assessment & Plan Note (Signed)
 Routine visit with no acute concerns. Discussed vaccine options and immunity status. No smoking, alcohol use, or mood issues. Engages in regular physical activity. Family history of father's multiple myeloma. - Ordered blood work for cholesterol, kidney function, measles immunity, and hepatitis B immunity. - Provided packet for advanced directive and healthcare power of attorney. - Encouraged obtaining flu shot when convenient. - Discussed Shingrix  and pneumonia vaccines; he declined. - Encouraged regular dental visits.

## 2024-02-23 NOTE — Patient Instructions (Signed)
 It was great to see you again today.  Checking labs Complete Advanced Directives packet  Be well, Dr. Donah

## 2024-02-23 NOTE — Progress Notes (Signed)
"  °  Date of Visit: 02/23/2024   SUBJECTIVE:   HPI:  Discussed the use of AI scribe software for clinical note transcription with the patient, who gave verbal consent to proceed.  History of Present Illness [name] is a 54 year old male who presents for an annual physical exam.  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 and physically active - Weight loss from 158 pounds to 144 pounds since starting current job    STD Screening: declines Smoking: no Alcohol: no Drugs: no Advance directives: does not have Mood: no concerns Cancers in family: father had multiple myeloma Dentist: yes  OBJECTIVE:   BP 116/72   Pulse 66   Ht 5' 6 (1.676 m)   Wt 144 lb 9.6 oz (65.6 kg)   SpO2 100%   BMI 23.34 kg/m  Gen: NAD, pleasant, cooperative HEENT: NCAT, PERRL, no palpable thyromegaly or anterior cervical lymphadenopathy Heart: RRR, no murmurs Lungs: CTAB, NWOB Abdomen: soft, nontender to palpation Neuro: grossly nonfocal, speech normal  ASSESSMENT/PLAN:   Assessment & Plan Routine adult health maintenance Routine visit with no acute concerns. Discussed vaccine options and immunity status. No smoking, alcohol use, or mood issues. Engages in regular physical activity. Family history of father's multiple myeloma. - Ordered blood work for cholesterol, kidney function, measles immunity, and hepatitis B immunity. - Provided packet for advanced directive and healthcare power of attorney. - Encouraged obtaining flu shot when  convenient. - Discussed Shingrix  and pneumonia vaccines; he declined. - Encouraged regular dental visits.  FOLLOW UP: Follow up in 1 year for CPE  Roshanda Balazs J. Donah, MD [facility]  "

## 2024-02-24 LAB — BASIC METABOLIC PANEL WITH GFR
BUN/Creatinine Ratio: 19 (ref 9–20)
BUN: 18 mg/dL (ref 6–24)
CO2: 25 mmol/L (ref 20–29)
Calcium: 10.1 mg/dL (ref 8.7–10.2)
Chloride: 101 mmol/L (ref 96–106)
Creatinine, Ser: 0.95 mg/dL (ref 0.76–1.27)
Glucose: 70 mg/dL (ref 70–99)
Potassium: 3.9 mmol/L (ref 3.5–5.2)
Sodium: 141 mmol/L (ref 134–144)
eGFR: 96 mL/min/1.73

## 2024-02-24 LAB — LIPID PANEL
Chol/HDL Ratio: 3.4 ratio (ref 0.0–5.0)
Cholesterol, Total: 257 mg/dL — ABNORMAL HIGH (ref 100–199)
HDL: 75 mg/dL
LDL Chol Calc (NIH): 170 mg/dL — ABNORMAL HIGH (ref 0–99)
Triglycerides: 74 mg/dL (ref 0–149)
VLDL Cholesterol Cal: 12 mg/dL (ref 5–40)

## 2024-02-24 LAB — HEPATITIS B SURFACE ANTIBODY, QUANTITATIVE: Hepatitis B Surf Ab Quant: <3.5 m[IU]/mL — ABNORMAL LOW

## 2024-02-24 LAB — RUBEOLA ANTIBODY IGG: RUBEOLA AB, IGG: <13.5 [AU]/ml — ABNORMAL LOW
